# Patient Record
Sex: Female | Born: 1990 | Race: White | Hispanic: No | Smoking: Former smoker
Health system: Southern US, Community
[De-identification: ages and names within clinical notes are randomized; demographics above are authoritative.]

## PROBLEM LIST (undated history)

## (undated) ENCOUNTER — Inpatient Hospital Stay (HOSPITAL_COMMUNITY): Payer: Self-pay

## (undated) DIAGNOSIS — R51 Headache: Secondary | ICD-10-CM

## (undated) DIAGNOSIS — R519 Headache, unspecified: Secondary | ICD-10-CM

## (undated) DIAGNOSIS — A630 Anogenital (venereal) warts: Secondary | ICD-10-CM

## (undated) DIAGNOSIS — F32A Depression, unspecified: Secondary | ICD-10-CM

## (undated) DIAGNOSIS — F329 Major depressive disorder, single episode, unspecified: Secondary | ICD-10-CM

## (undated) DIAGNOSIS — B009 Herpesviral infection, unspecified: Secondary | ICD-10-CM

## (undated) DIAGNOSIS — F319 Bipolar disorder, unspecified: Secondary | ICD-10-CM

## (undated) DIAGNOSIS — B999 Unspecified infectious disease: Secondary | ICD-10-CM

## (undated) HISTORY — DX: Major depressive disorder, single episode, unspecified: F32.9

## (undated) HISTORY — PX: NO PAST SURGERIES: SHX2092

## (undated) HISTORY — DX: Depression, unspecified: F32.A

## (undated) HISTORY — DX: Herpesviral infection, unspecified: B00.9

---
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
## 2008-10-20 ENCOUNTER — Ambulatory Visit: Payer: Self-pay | Admitting: Women's Health

## 2009-01-27 ENCOUNTER — Encounter: Payer: Self-pay | Admitting: Women's Health

## 2009-01-27 ENCOUNTER — Other Ambulatory Visit: Admission: RE | Admit: 2009-01-27 | Discharge: 2009-01-27 | Payer: Self-pay | Admitting: Gynecology

## 2009-01-27 ENCOUNTER — Ambulatory Visit: Payer: Self-pay | Admitting: Women's Health

## 2009-08-23 ENCOUNTER — Ambulatory Visit: Payer: Self-pay | Admitting: Women's Health

## 2010-06-07 ENCOUNTER — Other Ambulatory Visit: Admission: RE | Admit: 2010-06-07 | Discharge: 2010-06-07 | Payer: Self-pay | Admitting: Gynecology

## 2010-06-07 ENCOUNTER — Ambulatory Visit: Payer: Self-pay | Admitting: Women's Health

## 2011-06-25 ENCOUNTER — Telehealth: Payer: Self-pay

## 2011-06-25 MED ORDER — ETONOGESTREL-ETHINYL ESTRADIOL 0.12-0.015 MG/24HR VA RING: VAGINAL_RING | VAGINAL | Status: DC

## 2011-06-25 NOTE — Telephone Encounter (Signed)
PT. NOTIFIED SHE CAN PICK UP NUVARING SAMPLE BUT MUST SET UP AEX WITH NANCY WHILE IN OFFICE. I ALSO PUT A NOTE AT CHECK IN OF THE SAME INFO. ABOVE.

## 2011-06-27 LAB — POCT URINALYSIS DIP (DEVICE)
Bilirubin Urine: NEGATIVE
Glucose, UA: NEGATIVE
Nitrite: NEGATIVE

## 2011-06-27 LAB — URINE CULTURE: Colony Count: 75000

## 2011-06-27 LAB — POCT PREGNANCY, URINE: Preg Test, Ur: NEGATIVE

## 2011-07-11 ENCOUNTER — Encounter: Payer: Self-pay | Admitting: Women's Health

## 2011-07-11 ENCOUNTER — Other Ambulatory Visit (HOSPITAL_COMMUNITY)
Admission: RE | Admit: 2011-07-11 | Discharge: 2011-07-11 | Disposition: A | Discharge status: Home / Self Care | Payer: BC Managed Care – PPO | Source: Ambulatory Visit | Attending: Gynecology | Admitting: Gynecology

## 2011-07-11 ENCOUNTER — Ambulatory Visit (INDEPENDENT_AMBULATORY_CARE_PROVIDER_SITE_OTHER): Payer: BC Managed Care – PPO | Admitting: Women's Health

## 2011-07-11 VITALS — BP 110/70 | Ht 67.5 in | Wt 139.0 lb

## 2011-07-11 DIAGNOSIS — R82998 Other abnormal findings in urine: Secondary | ICD-10-CM

## 2011-07-11 DIAGNOSIS — Z01419 Encounter for gynecological examination (general) (routine) without abnormal findings: Secondary | ICD-10-CM

## 2011-07-11 DIAGNOSIS — B373 Candidiasis of vulva and vagina: Secondary | ICD-10-CM

## 2011-07-11 DIAGNOSIS — B3731 Acute candidiasis of vulva and vagina: Secondary | ICD-10-CM

## 2011-07-11 DIAGNOSIS — R350 Frequency of micturition: Secondary | ICD-10-CM

## 2011-07-11 DIAGNOSIS — Z113 Encounter for screening for infections with a predominantly sexual mode of transmission: Secondary | ICD-10-CM

## 2011-07-11 DIAGNOSIS — IMO0001 Reserved for inherently not codable concepts without codable children: Secondary | ICD-10-CM

## 2011-07-11 DIAGNOSIS — Z309 Encounter for contraceptive management, unspecified: Secondary | ICD-10-CM

## 2011-07-11 MED ORDER — ETONOGESTREL-ETHINYL ESTRADIOL 0.12-0.015 MG/24HR VA RING: VAGINAL_RING | VAGINAL | Status: DC

## 2011-07-11 MED ORDER — SULFAMETHOXAZOLE-TRIMETHOPRIM 800-160 MG PO TABS: 1.0000 | ORAL_TABLET | Freq: Two times a day (BID) | ORAL | Status: AC

## 2011-07-11 MED ORDER — FLUCONAZOLE 150 MG PO TABS: 150.0000 mg | ORAL_TABLET | Freq: Once | ORAL | Status: AC

## 2011-07-11 NOTE — Progress Notes (Signed)
Lindsay Robles 16-Jun-1991 [id number]    History:    The patient presents for annual exam.  Student at [employer], majoring in PT assistant.   Past medical history, past surgical history, family history and social history were all reviewed and documented in the EPIC chart.   ROS:  A  ROS was performed and pertinent positives and negatives are included in the history.  Exam:  Filed Vitals:   07/11/11 0957  BP: 110/70    General appearance:  Normal Head/Neck:  Normal, without cervical or supraclavicular adenopathy. Thyroid:  Symmetrical, normal in size, without palpable masses or nodularity. Respiratory  Effort:  Normal  Auscultation:  Clear without wheezing or rhonchi Cardiovascular  Auscultation:  Regular rate, without rubs, murmurs or gallops  Edema/varicosities:  Not grossly evident Abdominal  Soft,nontender, without masses, guarding or rebound.  Liver/spleen:  No organomegaly noted  Hernia:  None appreciated  Skin  Inspection:  Grossly normal  Palpation:  Grossly normal Neurologic/psychiatric  Orientation:  Normal with appropriate conversation.  Mood/affect:  Normal  Genitourinary    Breasts: Examined lying and sitting.     Right: Without masses, retractions, discharge or axillary adenopathy.     Left: Without masses, retractions, discharge or axillary adenopathy.   Inguinal/mons:  Normal without inguinal adenopathy  External genitalia:  Normal  BUS/Urethra/Skene's glands:  Normal  Bladder:  Normal  Vagina:  Normal  Cervix:  Normal  Uterus:   normal in size, shape and contour.  Midline and mobile  Adnexa/parametria:     Rt: Without masses or tenderness.   Lt: Without masses or tenderness.  Anus and perineum: Normal  Digital rectal exam:   Assessment/Plan:  20 y.o. SWFG0  for annual exam monthly 4-5d cycle on nuva ring. States has had some increased in frequency urgency and pain with urination. UA today does show 5-10 wbc's and +2 bacteria. New partner. Gardasil  completed in 08  Normal GYN exam/STD screen/UTI  Plan: Septra DS one by mouth twice a day for 3 days, prescription, proper use was given. Diflucan 150 by mouth x1 dose, states gets yeast symptoms with antibiotics. Return if no relief of symptoms. Nuva ring prescription, proper use, slight risk for blood clots strokes reviewed.  Sample, discount card was given. Condoms encouraged. SBEs, exercise, MVI daily encouraged, campus safety reviewed. CBC, UA, Pap, GC/ Chlamydia, HIV, hep B and C., RPRHarrington Challenger WHNP, 10:25 AM 07/11/2011

## 2011-07-12 LAB — HIV ANTIBODY (ROUTINE TESTING W REFLEX): HIV: NONREACTIVE

## 2011-08-05 ENCOUNTER — Telehealth: Payer: Self-pay | Admitting: *Deleted

## 2011-08-05 NOTE — Telephone Encounter (Signed)
Please call, inform we do not prescribe Adderall. Please have her call her primary care.

## 2011-08-05 NOTE — Telephone Encounter (Signed)
Patient requesting rx for Adderall.  Said pcp is not available and forgot to discuss with you at annual exam.  Has a test this week and would like to have it on board for that day.  She said she is not sure of the strength.  (dont see in chart)  Please advise

## 2011-08-05 NOTE — Telephone Encounter (Signed)
Please calland inform we do not prescribe Adderall. Have her call her primary care. And wish her luck onher test.

## 2011-08-06 NOTE — Telephone Encounter (Signed)
Patient informed. 

## 2011-10-21 ENCOUNTER — Encounter: Payer: Self-pay | Admitting: *Deleted

## 2011-10-21 NOTE — Progress Notes (Signed)
Patient ID: Lindsay Robles, female   DOB: [birth year], 21 y.o.   MRN: [id number] Pt is having hard time paying for nuvaring birth control wanted to know if could have samples, lm on pt vm okay to stop by and pick up samples.

## 2011-12-02 ENCOUNTER — Telehealth: Payer: Self-pay | Admitting: *Deleted

## 2011-12-02 NOTE — Telephone Encounter (Signed)
Pt called asking if she could have nuvaring samples. Pt is in college and having a hard time paying. Spoke with pt and told her okay to pick up samples.

## 2012-01-01 ENCOUNTER — Telehealth: Payer: Self-pay | Admitting: *Deleted

## 2012-01-01 NOTE — Telephone Encounter (Signed)
Pt called requesting 1 sample of nuvaring and wanted rx for UTI. Left message on pt vm OV for UTI and may have 1 sample of nuvaring.

## 2012-01-09 ENCOUNTER — Encounter: Payer: Self-pay | Admitting: Women's Health

## 2012-01-09 ENCOUNTER — Ambulatory Visit (INDEPENDENT_AMBULATORY_CARE_PROVIDER_SITE_OTHER): Payer: BC Managed Care – PPO | Admitting: Women's Health

## 2012-01-09 DIAGNOSIS — B379 Candidiasis, unspecified: Secondary | ICD-10-CM

## 2012-01-09 DIAGNOSIS — B49 Unspecified mycosis: Secondary | ICD-10-CM

## 2012-01-09 DIAGNOSIS — R300 Dysuria: Secondary | ICD-10-CM

## 2012-01-09 LAB — URINALYSIS W MICROSCOPIC + REFLEX CULTURE
Glucose, UA: NEGATIVE mg/dL
Leukocytes, UA: NEGATIVE
pH: 6.5 (ref 5.0–8.0)

## 2012-01-09 LAB — WET PREP FOR TRICH, YEAST, CLUE

## 2012-01-09 MED ORDER — FLUCONAZOLE 150 MG PO TABS: 150.0000 mg | ORAL_TABLET | Freq: Once | ORAL | Status: AC

## 2012-01-09 NOTE — Progress Notes (Signed)
Patient ID: Lindsay Robles, female   DOB: 03-Nov-1990, 21 y.o.   MRN: [id number] Presents with the complaint of increased urinary frequency, slight discomfort at end of stream, denies a fever and scant white discharge. Same partner, negative cultures contracepting on nuva ring.  Exam: No CVAT, he away negative, external genitalia slight erythema, speculum exam scant white discharge, wet prep positive for few yeast. Bimanual no CMT or adnexal fullness or tenderness.  Yeast  Plan: Urine culture pending. Diflucan 150 today repeat in 3 days if necessary call if no relief of symptoms.

## 2012-01-10 ENCOUNTER — Ambulatory Visit: Payer: BC Managed Care – PPO | Admitting: Women's Health

## 2012-01-12 LAB — URINE CULTURE: Colony Count: 75000

## 2012-01-13 ENCOUNTER — Other Ambulatory Visit: Payer: Self-pay | Admitting: *Deleted

## 2012-01-13 MED ORDER — CIPROFLOXACIN HCL 500 MG PO TABS: 500.0000 mg | ORAL_TABLET | Freq: Two times a day (BID) | ORAL | Status: AC

## 2012-04-03 ENCOUNTER — Ambulatory Visit (INDEPENDENT_AMBULATORY_CARE_PROVIDER_SITE_OTHER): Payer: BC Managed Care – PPO | Admitting: Family Medicine

## 2012-04-03 ENCOUNTER — Encounter: Payer: Self-pay | Admitting: Internal Medicine

## 2012-04-03 VITALS — BP 106/68 | HR 70 | Temp 98.3°F | Resp 16 | Ht 67.0 in | Wt 141.6 lb

## 2012-04-03 DIAGNOSIS — L84 Corns and callosities: Secondary | ICD-10-CM

## 2012-04-03 DIAGNOSIS — L0390 Cellulitis, unspecified: Secondary | ICD-10-CM

## 2012-04-03 DIAGNOSIS — B351 Tinea unguium: Secondary | ICD-10-CM

## 2012-04-03 DIAGNOSIS — L0291 Cutaneous abscess, unspecified: Secondary | ICD-10-CM

## 2012-04-03 MED ORDER — DOXYCYCLINE HYCLATE 100 MG PO TABS: 100.0000 mg | ORAL_TABLET | Freq: Two times a day (BID) | ORAL | Status: AC

## 2012-04-03 MED ORDER — TERBINAFINE HCL 250 MG PO TABS: 250.0000 mg | ORAL_TABLET | Freq: Every day | ORAL | Status: DC

## 2012-04-03 NOTE — Progress Notes (Signed)
This 21 year old woman who comes in for dilation of several problems. 2 weeks ago she lacerated her left lower extremity it's been having pain and some green eschar forming in the wound since. The area is indurated and slightly tender with surrounding erythema.  She also has a corn between her fourth and fifth toes on the right which has been going on since she wore tightfitting shoes month ago. She's on her feet all day long and this probably contributes to the problem.  Finally she notes thickening and distortion of the right fifth toenail. There is no pain in her and despite trimming the nail thickening has continued.  Objective: 1-cellulitis left lower leg surrounding a 1 inch laceration with green/black eschar. 2-corn between fourth and fifth toes was debrided with a #15 blade down to the base and a cotton wick with Coban was applied 3-onychomycotic right fifth toenail  Assessment: Cellulitis, corn, onychomycosis  Plan: Doxycycline 100 twice a day x7 days, Lamisil 250 daily x1 month, continue the cotton ball between the toes with Coban for month

## 2012-04-20 ENCOUNTER — Telehealth: Payer: Self-pay | Admitting: *Deleted

## 2012-04-20 DIAGNOSIS — IMO0001 Reserved for inherently not codable concepts without codable children: Secondary | ICD-10-CM

## 2012-04-20 MED ORDER — ETONOGESTREL-ETHINYL ESTRADIOL 0.12-0.015 MG/24HR VA RING: VAGINAL_RING | VAGINAL | Status: DC

## 2012-04-20 NOTE — Telephone Encounter (Signed)
Pt called requesting if 1 sample of nuvring can be given,pt will pick up. rx for nuvring sent to [facility] on [address] per request.

## 2012-06-01 ENCOUNTER — Telehealth: Payer: Self-pay | Admitting: *Deleted

## 2012-06-01 DIAGNOSIS — IMO0001 Reserved for inherently not codable concepts without codable children: Secondary | ICD-10-CM

## 2012-06-01 MED ORDER — ETONOGESTREL-ETHINYL ESTRADIOL 0.12-0.015 MG/24HR VA RING: VAGINAL_RING | VAGINAL | Status: DC

## 2012-06-01 NOTE — Telephone Encounter (Signed)
Pt called requesting nuvaring rx re-sent to [facility] [address], rx resent.

## 2012-07-29 ENCOUNTER — Telehealth: Payer: Self-pay

## 2012-07-29 NOTE — Telephone Encounter (Signed)
Patient called requesting an Rx refill for Amphetamine-Dextroamphetamine (ADDERALL PO). Patients pcp is [place] and wondered if she needed to come in for the refill to be seen or if it can be refilled for her to pick up. Call patient back at [phone].

## 2012-07-29 NOTE — Telephone Encounter (Signed)
D/W pt need to RTC for ADD OV and she agreed to come and see Dr L or another provider if she can not come during his schedule. Transferred pt to appt center to check on appts.

## 2012-09-14 ENCOUNTER — Encounter: Payer: BC Managed Care – PPO | Admitting: Women's Health

## 2012-11-13 ENCOUNTER — Other Ambulatory Visit (HOSPITAL_COMMUNITY)
Admission: RE | Admit: 2012-11-13 | Discharge: 2012-11-13 | Disposition: A | Discharge status: Home / Self Care | Payer: BC Managed Care – PPO | Source: Ambulatory Visit | Attending: Obstetrics and Gynecology | Admitting: Obstetrics and Gynecology

## 2012-11-13 ENCOUNTER — Encounter: Payer: Self-pay | Admitting: Women's Health

## 2012-11-13 ENCOUNTER — Ambulatory Visit (INDEPENDENT_AMBULATORY_CARE_PROVIDER_SITE_OTHER): Payer: BC Managed Care – PPO | Admitting: Women's Health

## 2012-11-13 VITALS — BP 116/64 | Ht 67.5 in | Wt 148.0 lb

## 2012-11-13 DIAGNOSIS — IMO0001 Reserved for inherently not codable concepts without codable children: Secondary | ICD-10-CM

## 2012-11-13 DIAGNOSIS — Z01419 Encounter for gynecological examination (general) (routine) without abnormal findings: Secondary | ICD-10-CM | POA: Insufficient documentation

## 2012-11-13 DIAGNOSIS — Z309 Encounter for contraceptive management, unspecified: Secondary | ICD-10-CM

## 2012-11-13 MED ORDER — ETONOGESTREL-ETHINYL ESTRADIOL 0.12-0.015 MG/24HR VA RING: VAGINAL_RING | VAGINAL | Status: DC

## 2012-11-13 NOTE — Patient Instructions (Signed)

## 2012-11-13 NOTE — Progress Notes (Signed)
Lindsay Robles 24-Jun-1991 [id number]    History:    The patient presents for annual exam.  Regular menses,  on Nuva Ring with no complaints. 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 Gardasil series completed 2008. C/o burning with urination, especially after soap use, relieved with OTC Azo. Same partner, 2 years, condom used. C/o intermittent, uncomfortable bump, superior right labia majora/no change.   Past medical history, past surgical history, family history and social history were all reviewed and documented in the EPIC chart. Diplomatic Services operational officer at [employer]. 1-2 cigarettes/day. Breast Cancer- PGM, Heart Disease- MGM, HTN - F   ROS:  A  ROS was performed and pertinent positives and negatives are included in the history.  Exam:  Filed Vitals:   11/13/22 1452  BP: 116/64    General appearance:  Normal Head/Neck:  Normal, without cervical or supraclavicular adenopathy. Thyroid:  Symmetrical, normal in size, without palpable masses or nodularity. Respiratory  Effort:  Normal  Auscultation:  Clear without wheezing or rhonchi Cardiovascular  Auscultation:  Regular rate, without rubs, murmurs or gallops  Edema/varicosities:  Not grossly evident Abdominal  Soft,nontender, without masses, guarding or rebound.  Liver/spleen:  No organomegaly noted  Hernia:  None appreciated  Skin  Inspection:  Grossly normal  Palpation:  Grossly normal Neurologic/psychiatric  Orientation:  Normal with appropriate conversation.  Mood/affect:  Normal  Genitourinary    Breasts: Examined lying and sitting.     Right: Without masses, retractions, discharge or axillary adenopathy.     Left: Without masses, retractions, discharge or axillary adenopathy.   Inguinal/mons:  Normal without inguinal adenopathy  External genitalia:  small, superficial 0.5 cm sebaceous cyst, right superior labia majora. No change  BUS/Urethra/Skene's glands:  Normal  Bladder:  Normal  Vagina:  Normal  Cervix:  Normal  Uterus:   normal in size, shape and contour.  Midline and mobile  Adnexa/parametria:     Rt: Without masses or tenderness.   Lt: Without masses or tenderness.  Anus and perineum: Normal    Assessment/Plan:  22 y.o.  S WF G0 for annual exam.     PAP, 10/12 CIN 1 Nuva Ring  Plan:Nuva Ring Rx, reviewed slight risk of blood clot and stroke. PAP, new guidelines reviewed. Encouraged healthy diet, exercise, daily multivitamin, SBEs, condom use, and avoidance of tanning beds. Discussed importance of no smoking. CBC deferred until next visit, fear of needles.UAHarrington Challenger WHNP, 3:24 PM 11/13/2012

## 2012-11-14 LAB — URINALYSIS W MICROSCOPIC + REFLEX CULTURE
Glucose, UA: NEGATIVE mg/dL
Hgb urine dipstick: NEGATIVE
Ketones, ur: NEGATIVE mg/dL
pH: 6 (ref 5.0–8.0)

## 2012-11-15 LAB — URINE CULTURE: Colony Count: NO GROWTH

## 2012-11-23 ENCOUNTER — Encounter: Payer: BC Managed Care – PPO | Admitting: Women's Health

## 2012-12-16 ENCOUNTER — Ambulatory Visit: Payer: BC Managed Care – PPO | Admitting: Family Medicine

## 2013-04-27 ENCOUNTER — Telehealth: Payer: Self-pay | Admitting: *Deleted

## 2013-04-27 NOTE — Telephone Encounter (Signed)
Pt called requesting 1 sample of nuvaring, left message on voicemail okay to pick up 1 sample only. Pt said she is unable to get rx now due to funds.

## 2013-06-25 ENCOUNTER — Telehealth: Payer: Self-pay | Admitting: *Deleted

## 2013-06-25 NOTE — Telephone Encounter (Signed)
Pt called requesting sample nuvaring, told pt okay to pick up 1 sample. She also asked when was last annual, left on voicemail feb/2014.

## 2013-08-20 ENCOUNTER — Telehealth: Payer: Self-pay | Admitting: *Deleted

## 2013-08-20 NOTE — Telephone Encounter (Signed)
Pt called requesting nuvaring sample, I called back and told okay to come pick up.

## 2013-10-15 ENCOUNTER — Telehealth: Payer: Self-pay | Admitting: *Deleted

## 2013-10-15 NOTE — Telephone Encounter (Signed)
PT CALLED REQUESTING NUVARING SAMPLE TO PICK, LEFT ON VOICEMAIL OKAY TO PICK UP.

## 2013-10-18 ENCOUNTER — Telehealth: Payer: Self-pay | Admitting: *Deleted

## 2013-10-18 MED ORDER — FLUCONAZOLE 150 MG PO TABS: 150.0000 mg | ORAL_TABLET | Freq: Once | ORAL | Status: DC

## 2013-10-18 NOTE — Telephone Encounter (Signed)
rx sent, pt informed.  

## 2013-10-18 NOTE — Telephone Encounter (Signed)
(  You are back up MD)Pt called c/o terrible yeast infection vaginal itching, vaginal irritation cycle is on now, asked if you would be willing to give Rx to help?

## 2013-10-18 NOTE — Telephone Encounter (Signed)
Diflucan 150 mg x1 dose. Office visit if continues.

## 2013-11-25 ENCOUNTER — Ambulatory Visit (INDEPENDENT_AMBULATORY_CARE_PROVIDER_SITE_OTHER): Payer: BC Managed Care – PPO | Admitting: Family Medicine

## 2013-11-25 VITALS — BP 110/60 | HR 91 | Temp 98.0°F | Resp 16 | Ht 68.0 in | Wt 144.0 lb

## 2013-11-25 DIAGNOSIS — J321 Chronic frontal sinusitis: Secondary | ICD-10-CM

## 2013-11-25 DIAGNOSIS — R51 Headache: Secondary | ICD-10-CM

## 2013-11-25 DIAGNOSIS — J309 Allergic rhinitis, unspecified: Secondary | ICD-10-CM

## 2013-11-25 MED ORDER — AMOXICILLIN-POT CLAVULANATE 875-125 MG PO TABS: 1.0000 | ORAL_TABLET | Freq: Two times a day (BID) | ORAL | Status: DC

## 2013-11-25 NOTE — Patient Instructions (Signed)
Start antibiotic, saline nasal spray 4-5 times a day and over the counter Allegra and Flonase nasal spray. Return to the clinic or go to the nearest emergency room if any of your symptoms worsen or new symptoms occur.   Allergic Rhinitis Allergic rhinitis is when the mucous membranes in the nose respond to allergens. Allergens are particles in the air that cause your body to have an allergic reaction. This causes you to release allergic antibodies. Through a chain of events, these eventually cause you to release histamine into the blood stream. Although meant to protect the body, it is this release of histamine that causes your discomfort, such as frequent sneezing, congestion, and an itchy, runny nose.  CAUSES  Seasonal allergic rhinitis (hay fever) is caused by pollen allergens that may come from grasses, trees, and weeds. Year-round allergic rhinitis (perennial allergic rhinitis) is caused by allergens such as house dust mites, pet dander, and mold spores.  SYMPTOMS   Nasal stuffiness (congestion).  Itchy, runny nose with sneezing and tearing of the eyes. DIAGNOSIS  Your health care provider can help you determine the allergen or allergens that trigger your symptoms. If you and your health care provider are unable to determine the allergen, skin or blood testing may be used. TREATMENT  Allergic Rhinitis does not have a cure, but it can be controlled by:  Medicines and allergy shots (immunotherapy).  Avoiding the allergen. Hay fever may often be treated with antihistamines in pill or nasal spray forms. Antihistamines block the effects of histamine. There are over-the-counter medicines that may help with nasal congestion and swelling around the eyes. Check with your health care provider before taking or giving this medicine.  If avoiding the allergen or the medicine prescribed do not work, there are many new medicines your health care provider can prescribe. Stronger medicine may be used if  initial measures are ineffective. Desensitizing injections can be used if medicine and avoidance does not work. Desensitization is when a patient is given ongoing shots until the body becomes less sensitive to the allergen. Make sure you follow up with your health care provider if problems continue. HOME CARE INSTRUCTIONS It is not possible to completely avoid allergens, but you can reduce your symptoms by taking steps to limit your exposure to them. It helps to know exactly what you are allergic to so that you can avoid your specific triggers. SEEK MEDICAL CARE IF:   You have a fever.  You develop a cough that does not stop easily (persistent).  You have shortness of breath.  You start wheezing.  Symptoms interfere with normal daily activities. Document Released: 06/18/2001 Document Revised: 07/14/2013 Document Reviewed: 05/31/2013 [facility] Patient Information 2014 [place], [place].   Sinusitis Sinusitis is redness, soreness, and swelling (inflammation) of the paranasal sinuses. Paranasal sinuses are air pockets within the bones of your face (beneath the eyes, the middle of the forehead, or above the eyes). In healthy paranasal sinuses, mucus is able to drain out, and air is able to circulate through them by way of your nose. However, when your paranasal sinuses are inflamed, mucus and air can become trapped. This can allow bacteria and other germs to grow and cause infection. Sinusitis can develop quickly and last only a short time (acute) or continue over a long period (chronic). Sinusitis that lasts for more than 12 weeks is considered chronic.  CAUSES  Causes of sinusitis include:  Allergies.  Structural abnormalities, such as displacement of the cartilage that separates your nostrils (deviated  septum), which can decrease the air flow through your nose and sinuses and affect sinus drainage.  Functional abnormalities, such as when the small hairs (cilia) that line your sinuses and help  remove mucus do not work properly or are not present. SYMPTOMS  Symptoms of acute and chronic sinusitis are the same. The primary symptoms are pain and pressure around the affected sinuses. Other symptoms include:  Upper toothache.  Earache.  Headache.  Bad breath.  Decreased sense of smell and taste.  A cough, which worsens when you are lying flat.  Fatigue.  Fever.  Thick drainage from your nose, which often is green and may contain pus (purulent).  Swelling and warmth over the affected sinuses. DIAGNOSIS  Your caregiver will perform a physical exam. During the exam, your caregiver may:  Look in your nose for signs of abnormal growths in your nostrils (nasal polyps).  Tap over the affected sinus to check for signs of infection.  View the inside of your sinuses (endoscopy) with a special imaging device with a light attached (endoscope), which is inserted into your sinuses. If your caregiver suspects that you have chronic sinusitis, one or more of the following tests may be recommended:  Allergy tests.  Nasal culture A sample of mucus is taken from your nose and sent to a lab and screened for bacteria.  Nasal cytology A sample of mucus is taken from your nose and examined by your caregiver to determine if your sinusitis is related to an allergy. TREATMENT  Most cases of acute sinusitis are related to a viral infection and will resolve on their own within 10 days. Sometimes medicines are prescribed to help relieve symptoms (pain medicine, decongestants, nasal steroid sprays, or saline sprays).  However, for sinusitis related to a bacterial infection, your caregiver will prescribe antibiotic medicines. These are medicines that will help kill the bacteria causing the infection.  Rarely, sinusitis is caused by a fungal infection. In theses cases, your caregiver will prescribe antifungal medicine. For some cases of chronic sinusitis, surgery is needed. Generally, these are cases  in which sinusitis recurs more than 3 times per year, despite other treatments. HOME CARE INSTRUCTIONS   Drink plenty of water. Water helps thin the mucus so your sinuses can drain more easily.  Use a humidifier.  Inhale steam 3 to 4 times a day (for example, sit in the bathroom with the shower running).  Apply a warm, moist washcloth to your face 3 to 4 times a day, or as directed by your caregiver.  Use saline nasal sprays to help moisten and clean your sinuses.  Take over-the-counter or prescription medicines for pain, discomfort, or fever only as directed by your caregiver. SEEK IMMEDIATE MEDICAL CARE IF:  You have increasing pain or severe headaches.  You have nausea, vomiting, or drowsiness.  You have swelling around your face.  You have vision problems.  You have a stiff neck.  You have difficulty breathing. MAKE SURE YOU:   Understand these instructions.  Will watch your condition.  Will get help right away if you are not doing well or get worse. Document Released: 09/23/2005 Document Revised: 12/16/2011 Document Reviewed: 10/08/2011 [facility] Patient Information 2014 [place], [place].

## 2013-11-25 NOTE — Progress Notes (Signed)
Subjective:    Patient ID: Lindsay Robles, female    DOB: 01-Jan-1991, 23 y.o.   MRN: [id number] This chart was scribed for Lindsay Staggers, MD by Valera Castle, ED Scribe. This patient was seen in room 12 and the patient's care was started at 4:36 PM.  Chief Complaint  Patient presents with  . Headache    x 3 weeks  . Sore Throat    x 3 weeks   HPI MAURENE Robles is a 23 y.o. female Pt presents with sore throat, post-nasal drip, rhinorrhea, onset 1 month ago. She reports yellow nasal discharge for the last 2 weeks. She reports headache, sinus pressure onset 1 week ago. She reports this morning waking up with worsening headache, and states that movement of her head exacerbates her pain. She also reports persistent, cough minimally productive and generalized body aches. She reports trying Ibuprofen, Dayquil, without relief. She denies trying Sudafed. She denies allergies to antibiotic. She reports h/o headaches, and reports her father has h/o headaches.   She denies being around any sick contacts. She denies having recently moved to the area. She reports seasonal allergies in the Summer time. She denies having done recent cleaning, denies allergy to dust. 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 She has 3 mixed dogs at home. She denies fever, weight loss, ear pain, and any other associated symptoms.  PCP Elvina Sidle, MD  There are no active problems to display for this patient.  History reviewed. No pertinent past medical history. History reviewed. No pertinent past surgical history. No Known Allergies Prior to Admission medications   Medication Sig Start Date End Date Taking? Authorizing Provider  etonogestrel-ethinyl estradiol (NUVARING) 0.12-0.015 MG/24HR vaginal ring Insert vaginally and leave in place for 3 consecutive weeks, then remove for 1 week. 11/13/12 11/25/13 Yes Harrington Challenger, NP   Review of Systems  Constitutional: Negative for fever and unexpected weight  change.  HENT: Positive for congestion, postnasal drip, rhinorrhea, sinus pressure and sore throat. Negative for ear pain and trouble swallowing.   Respiratory: Positive for cough.   Musculoskeletal:       + for generalized body aches  Allergic/Immunologic: Positive for environmental allergies.  Neurological: Positive for headaches.      Objective:   Physical Exam  Vitals reviewed. Constitutional: She is oriented to person, place, and time. She appears well-developed and well-nourished. No distress.  HENT:  Head: Normocephalic and atraumatic.  Right Ear: Hearing, tympanic membrane, external ear and ear canal normal.  Left Ear: Hearing, tympanic membrane, external ear and ear canal normal.  Nose: Mucosal edema present. No rhinorrhea. Right sinus exhibits frontal sinus tenderness. Right sinus exhibits no maxillary sinus tenderness. Left sinus exhibits frontal sinus tenderness. Left sinus exhibits no maxillary sinus tenderness.  Mouth/Throat: Uvula is midline, oropharynx is clear and moist and mucous membranes are normal. No oropharyngeal exudate, posterior oropharyngeal edema, posterior oropharyngeal erythema or tonsillar abscesses.  Edematous turbinates bilaterally. No discharge.   Eyes: Conjunctivae and EOM are normal. Pupils are equal, round, and reactive to light.  Minimal infraorbital edema.   Neck: Normal range of motion. Neck supple. No thyromegaly present.  Cardiovascular: Normal rate, regular rhythm, normal heart sounds and intact distal pulses.  Exam reveals no gallop and no friction rub.   No murmur heard. Pulmonary/Chest: Effort normal and breath sounds normal. No respiratory distress. She has no wheezes. She has no rhonchi. She has no rales.  Musculoskeletal: Normal range of motion. She exhibits no  edema and no tenderness.  Lymphadenopathy:    She has no cervical adenopathy.  Neurological: She is alert and oriented to person, place, and time. No cranial nerve deficit. She  exhibits normal muscle tone. She displays a negative Romberg sign. Coordination and gait normal.  Negative pronator drift. Negative heel to toe.  Skin: Skin is warm and dry. No rash noted.  Psychiatric: She has a normal mood and affect. Her behavior is normal.   BP 110/60  Pulse 91  Temp(Src) 98 F (36.7 C) (Oral)  Resp 16  Ht 5\' 8"  (1.727 m)  Wt 144 lb (65.318 kg)  BMI 21.90 kg/m2  SpO2 98%  LMP 11/01/2013     Assessment & Plan:   Lindsay Robles is a 23 y.o. female Frontal sinusitis - Plan: amoxicillin-clavulanate (AUGMENTIN) 875-125 MG per tablet  Allergic rhinitis  Headache(784.0)  Suspected allergic rhinitis, with secondary frontal sinusitis. Start otc allegra and flonase, augmentin, sx care, and rtc precautions given.   Meds ordered this encounter  Medications  . amoxicillin-clavulanate (AUGMENTIN) 875-125 MG per tablet    Sig: Take 1 tablet by mouth 2 (two) times daily.    Dispense:  20 tablet    Refill:  0   Patient Instructions  Start antibiotic, saline nasal spray 4-5 times a day and over the counter Allegra and Flonase nasal spray. Return to the clinic or go to the nearest emergency room if any of your symptoms worsen or new symptoms occur.   Allergic Rhinitis Allergic rhinitis is when the mucous membranes in the nose respond to allergens. Allergens are particles in the air that cause your body to have an allergic reaction. This causes you to release allergic antibodies. Through a chain of events, these eventually cause you to release histamine into the blood stream. Although meant to protect the body, it is this release of histamine that causes your discomfort, such as frequent sneezing, congestion, and an itchy, runny nose.  CAUSES  Seasonal allergic rhinitis (hay fever) is caused by pollen allergens that may come from grasses, trees, and weeds. Year-round allergic rhinitis (perennial allergic rhinitis) is caused by allergens such as house dust mites, pet  dander, and mold spores.  SYMPTOMS   Nasal stuffiness (congestion).  Itchy, runny nose with sneezing and tearing of the eyes. DIAGNOSIS  Your health care provider can help you determine the allergen or allergens that trigger your symptoms. If you and your health care provider are unable to determine the allergen, skin or blood testing may be used. TREATMENT  Allergic Rhinitis does not have a cure, but it can be controlled by:  Medicines and allergy shots (immunotherapy).  Avoiding the allergen. Hay fever may often be treated with antihistamines in pill or nasal spray forms. Antihistamines block the effects of histamine. There are over-the-counter medicines that may help with nasal congestion and swelling around the eyes. Check with your health care provider before taking or giving this medicine.  If avoiding the allergen or the medicine prescribed do not work, there are many new medicines your health care provider can prescribe. Stronger medicine may be used if initial measures are ineffective. Desensitizing injections can be used if medicine and avoidance does not work. Desensitization is when a patient is given ongoing shots until the body becomes less sensitive to the allergen. Make sure you follow up with your health care provider if problems continue. HOME CARE INSTRUCTIONS It is not possible to completely avoid allergens, but you can reduce your symptoms by taking  steps to limit your exposure to them. It helps to know exactly what you are allergic to so that you can avoid your specific triggers. SEEK MEDICAL CARE IF:   You have a fever.  You develop a cough that does not stop easily (persistent).  You have shortness of breath.  You start wheezing.  Symptoms interfere with normal daily activities. Document Released: 06/18/2001 Document Revised: 07/14/2013 Document Reviewed: 05/31/2013 [facility] Patient Information 2014 [place], [place].   Sinusitis Sinusitis is redness, soreness,  and swelling (inflammation) of the paranasal sinuses. Paranasal sinuses are air pockets within the bones of your face (beneath the eyes, the middle of the forehead, or above the eyes). In healthy paranasal sinuses, mucus is able to drain out, and air is able to circulate through them by way of your nose. However, when your paranasal sinuses are inflamed, mucus and air can become trapped. This can allow bacteria and other germs to grow and cause infection. Sinusitis can develop quickly and last only a short time (acute) or continue over a long period (chronic). Sinusitis that lasts for more than 12 weeks is considered chronic.  CAUSES  Causes of sinusitis include:  Allergies.  Structural abnormalities, such as displacement of the cartilage that separates your nostrils (deviated septum), which can decrease the air flow through your nose and sinuses and affect sinus drainage.  Functional abnormalities, such as when the small hairs (cilia) that line your sinuses and help remove mucus do not work properly or are not present. SYMPTOMS  Symptoms of acute and chronic sinusitis are the same. The primary symptoms are pain and pressure around the affected sinuses. Other symptoms include:  Upper toothache.  Earache.  Headache.  Bad breath.  Decreased sense of smell and taste.  A cough, which worsens when you are lying flat.  Fatigue.  Fever.  Thick drainage from your nose, which often is green and may contain pus (purulent).  Swelling and warmth over the affected sinuses. DIAGNOSIS  Your caregiver will perform a physical exam. During the exam, your caregiver may:  Look in your nose for signs of abnormal growths in your nostrils (nasal polyps).  Tap over the affected sinus to check for signs of infection.  View the inside of your sinuses (endoscopy) with a special imaging device with a light attached (endoscope), which is inserted into your sinuses. If your caregiver suspects that you have  chronic sinusitis, one or more of the following tests may be recommended:  Allergy tests.  Nasal culture A sample of mucus is taken from your nose and sent to a lab and screened for bacteria.  Nasal cytology A sample of mucus is taken from your nose and examined by your caregiver to determine if your sinusitis is related to an allergy. TREATMENT  Most cases of acute sinusitis are related to a viral infection and will resolve on their own within 10 days. Sometimes medicines are prescribed to help relieve symptoms (pain medicine, decongestants, nasal steroid sprays, or saline sprays).  However, for sinusitis related to a bacterial infection, your caregiver will prescribe antibiotic medicines. These are medicines that will help kill the bacteria causing the infection.  Rarely, sinusitis is caused by a fungal infection. In theses cases, your caregiver will prescribe antifungal medicine. For some cases of chronic sinusitis, surgery is needed. Generally, these are cases in which sinusitis recurs more than 3 times per year, despite other treatments. HOME CARE INSTRUCTIONS   Drink plenty of water. Water helps thin the mucus so your  sinuses can drain more easily.  Use a humidifier.  Inhale steam 3 to 4 times a day (for example, sit in the bathroom with the shower running).  Apply a warm, moist washcloth to your face 3 to 4 times a day, or as directed by your caregiver.  Use saline nasal sprays to help moisten and clean your sinuses.  Take over-the-counter or prescription medicines for pain, discomfort, or fever only as directed by your caregiver. SEEK IMMEDIATE MEDICAL CARE IF:  You have increasing pain or severe headaches.  You have nausea, vomiting, or drowsiness.  You have swelling around your face.  You have vision problems.  You have a stiff neck.  You have difficulty breathing. MAKE SURE YOU:   Understand these instructions.  Will watch your condition.  Will get help right  away if you are not doing well or get worse. Document Released: 09/23/2005 Document Revised: 12/16/2011 Document Reviewed: 10/08/2011 [facility] Patient Information 2014 [place], [place].        I personally performed the services described in this documentation, which was scribed in my presence. The recorded information has been reviewed and considered, and addended by me as needed.

## 2013-11-29 ENCOUNTER — Telehealth: Payer: Self-pay | Admitting: *Deleted

## 2013-11-29 NOTE — Telephone Encounter (Signed)
Pt has annual scheduled in march requesting nuvaring sample. Pt informed okay to pick up 1 sample.

## 2013-12-21 ENCOUNTER — Encounter: Payer: BC Managed Care – PPO | Admitting: Women's Health

## 2014-01-31 ENCOUNTER — Other Ambulatory Visit: Payer: Self-pay

## 2014-01-31 DIAGNOSIS — IMO0001 Reserved for inherently not codable concepts without codable children: Secondary | ICD-10-CM

## 2014-01-31 MED ORDER — ETONOGESTREL-ETHINYL ESTRADIOL 0.12-0.015 MG/24HR VA RING: VAGINAL_RING | VAGINAL | Status: DC

## 2014-02-15 ENCOUNTER — Encounter: Payer: BC Managed Care – PPO | Admitting: Women's Health

## 2014-03-03 ENCOUNTER — Encounter: Payer: Self-pay | Admitting: Women's Health

## 2014-03-03 ENCOUNTER — Other Ambulatory Visit (HOSPITAL_COMMUNITY)
Admission: RE | Admit: 2014-03-03 | Discharge: 2014-03-03 | Disposition: A | Discharge status: Home / Self Care | Payer: BC Managed Care – PPO | Source: Ambulatory Visit | Attending: Gynecology | Admitting: Gynecology

## 2014-03-03 ENCOUNTER — Ambulatory Visit (INDEPENDENT_AMBULATORY_CARE_PROVIDER_SITE_OTHER): Payer: BC Managed Care – PPO | Admitting: Women's Health

## 2014-03-03 VITALS — BP 120/70 | Ht 67.25 in | Wt 147.0 lb

## 2014-03-03 DIAGNOSIS — Z113 Encounter for screening for infections with a predominantly sexual mode of transmission: Secondary | ICD-10-CM

## 2014-03-03 DIAGNOSIS — Z01419 Encounter for gynecological examination (general) (routine) without abnormal findings: Secondary | ICD-10-CM | POA: Insufficient documentation

## 2014-03-03 DIAGNOSIS — B3731 Acute candidiasis of vulva and vagina: Secondary | ICD-10-CM

## 2014-03-03 DIAGNOSIS — B373 Candidiasis of vulva and vagina: Secondary | ICD-10-CM

## 2014-03-03 DIAGNOSIS — Z309 Encounter for contraceptive management, unspecified: Secondary | ICD-10-CM

## 2014-03-03 DIAGNOSIS — IMO0001 Reserved for inherently not codable concepts without codable children: Secondary | ICD-10-CM

## 2014-03-03 DIAGNOSIS — F17200 Nicotine dependence, unspecified, uncomplicated: Secondary | ICD-10-CM

## 2014-03-03 DIAGNOSIS — F17210 Nicotine dependence, cigarettes, uncomplicated: Secondary | ICD-10-CM | POA: Insufficient documentation

## 2014-03-03 DIAGNOSIS — N898 Other specified noninflammatory disorders of vagina: Secondary | ICD-10-CM

## 2014-03-03 LAB — CBC WITH DIFFERENTIAL/PLATELET
BASOS ABS: 0 10*3/uL (ref 0.0–0.1)
BASOS PCT: 0 % (ref 0–1)
EOS PCT: 1 % (ref 0–5)
Eosinophils Absolute: 0.1 10*3/uL (ref 0.0–0.7)
HEMATOCRIT: 40.6 % (ref 36.0–46.0)
Hemoglobin: 13.9 g/dL (ref 12.0–15.0)
Lymphocytes Relative: 28 % (ref 12–46)
Lymphs Abs: 2 10*3/uL (ref 0.7–4.0)
MCH: 30.5 pg (ref 26.0–34.0)
MCHC: 34.2 g/dL (ref 30.0–36.0)
MCV: 89 fL (ref 78.0–100.0)
MONO ABS: 0.6 10*3/uL (ref 0.1–1.0)
Monocytes Relative: 8 % (ref 3–12)
NEUTROS ABS: 4.6 10*3/uL (ref 1.7–7.7)
Neutrophils Relative %: 63 % (ref 43–77)
PLATELETS: 356 10*3/uL (ref 150–400)
RBC: 4.56 MIL/uL (ref 3.87–5.11)
RDW: 13.6 % (ref 11.5–15.5)
WBC: 7.3 10*3/uL (ref 4.0–10.5)

## 2014-03-03 LAB — WET PREP FOR TRICH, YEAST, CLUE
Clue Cells Wet Prep HPF POC: NONE SEEN
TRICH WET PREP: NONE SEEN

## 2014-03-03 MED ORDER — ETONOGESTREL-ETHINYL ESTRADIOL 0.12-0.015 MG/24HR VA RING: VAGINAL_RING | VAGINAL | Status: DC

## 2014-03-03 MED ORDER — FLUCONAZOLE 150 MG PO TABS: 150.0000 mg | ORAL_TABLET | Freq: Once | ORAL | Status: DC

## 2014-03-03 NOTE — Patient Instructions (Signed)

## 2014-03-03 NOTE — Progress Notes (Addendum)
Lindsay Robles May 14, 1991 [id number]    History:    Presents for annual exam.  Regular monthly cycle on NuvaRing. Gardasil series completed 2008. New partner. 2012 Pap LGSIL without followup, Pap 2014 normal. Smokes several cigarettes daily.  Past medical history, past surgical history, family history and social history were all reviewed and documented in the EPIC chart. Works in Set [employer]. Paternal grandmother breast cancer, maternal grandmother heart disease  ROS:  A  12 point ROS was performed and pertinent positives and negatives are included.  Exam:  Filed Vitals:   03/03/14 0953  BP: 120/70    General appearance:  Normal Thyroid:  Symmetrical, normal in size, without palpable masses or nodularity. Respiratory  Auscultation:  Clear without wheezing or rhonchi Cardiovascular  Auscultation:  Regular rate, without rubs, murmurs or gallops  Edema/varicosities:  Not grossly evident Abdominal  Soft,nontender, without masses, guarding or rebound.  Liver/spleen:  No organomegaly noted  Hernia:  None appreciated  Skin  Inspection:  Grossly normal   Breasts: Examined lying and sitting.     Right: Without masses, retractions, discharge or axillary adenopathy.     Left: Without masses, retractions, discharge or axillary adenopathy. Gentitourinary   Inguinal/mons:  Normal without inguinal adenopathy  External genitalia:  Normal  BUS/Urethra/Skene's glands:  Normal  Vagina:  Minimal erythema -wet prep, positive for yeast  Cervix:  Normal  Uterus:   normal in size, shape and contour.  Midline and mobile  Adnexa/parametria:     Rt: Without masses or tenderness.   Lt: Without masses or tenderness.  Anus and perineum: Normal    Assessment/Plan:  23 y.o. SWF G0 for annual exam with no complaints.  2012 LGSIL with normal Paps after Smoker STD screen Yeast  Plan: NuvaRing prescription, proper use given and reviewed slight risk for blood clots and strokes. Reviewed  importance of no smoking. Condoms encouraged until permanent partner. Diflucan 150 by mouth x1 days with refills if needed. Yeast prevention discussed. SBE's, exercise, calcium rich diet, MVI daily encouraged. CBC, UA, Pap, GC/Chlamydia, HIV, hep B, C., RPR.   Note: This dictation was prepared with Dragon/digital dictation.  Any transcriptional errors that result are unintentional. Harrington Challenger [facility], 1:06 PM 03/03/2014

## 2014-03-04 LAB — URINALYSIS W MICROSCOPIC + REFLEX CULTURE
BILIRUBIN URINE: NEGATIVE
Casts: NONE SEEN
Crystals: NONE SEEN
Glucose, UA: NEGATIVE mg/dL
Hgb urine dipstick: NEGATIVE
KETONES UR: NEGATIVE mg/dL
Nitrite: NEGATIVE
PROTEIN: NEGATIVE mg/dL
Specific Gravity, Urine: 1.017 (ref 1.005–1.030)
UROBILINOGEN UA: 0.2 mg/dL (ref 0.0–1.0)
pH: 7.5 (ref 5.0–8.0)

## 2014-03-04 LAB — HIV ANTIBODY (ROUTINE TESTING W REFLEX): HIV 1&2 Ab, 4th Generation: NONREACTIVE

## 2014-03-04 LAB — RPR

## 2014-03-04 LAB — GC/CHLAMYDIA PROBE AMP
CT PROBE, AMP APTIMA: NEGATIVE
GC PROBE AMP APTIMA: NEGATIVE

## 2014-03-04 LAB — HEPATITIS C ANTIBODY: HCV Ab: NEGATIVE

## 2014-03-04 LAB — HEPATITIS B SURFACE ANTIGEN: HEP B S AG: NEGATIVE

## 2014-03-07 ENCOUNTER — Other Ambulatory Visit: Payer: Self-pay | Admitting: Women's Health

## 2014-03-07 LAB — URINE CULTURE: Colony Count: >=100000

## 2014-03-07 MED ORDER — CIPROFLOXACIN HCL 250 MG PO TABS: 250.0000 mg | ORAL_TABLET | Freq: Two times a day (BID) | ORAL | Status: DC

## 2014-03-08 ENCOUNTER — Telehealth: Payer: Self-pay

## 2014-03-08 DIAGNOSIS — R21 Rash and other nonspecific skin eruption: Secondary | ICD-10-CM

## 2014-03-08 MED ORDER — DOXYCYCLINE HYCLATE 100 MG PO TABS: 100.0000 mg | ORAL_TABLET | Freq: Two times a day (BID) | ORAL | Status: DC

## 2014-03-08 NOTE — Telephone Encounter (Signed)
PT STATES SHE HAVE A SPIDER OR TICK BITE ON HER RIGHT LEG AND SINCE SHE IS A FAMILY FRIEND OF DR Kenyon Ana, WOULD LIKE TO HAVE SOMETHING CALLED IN FOR IT PLEASE CALL PT AT [phone]   [facility] ON [address]

## 2014-06-14 ENCOUNTER — Ambulatory Visit (INDEPENDENT_AMBULATORY_CARE_PROVIDER_SITE_OTHER): Payer: BC Managed Care – PPO | Admitting: Women's Health

## 2014-06-14 ENCOUNTER — Encounter: Payer: Self-pay | Admitting: Women's Health

## 2014-06-14 DIAGNOSIS — N390 Urinary tract infection, site not specified: Secondary | ICD-10-CM

## 2014-06-14 LAB — WET PREP FOR TRICH, YEAST, CLUE
Clue Cells Wet Prep HPF POC: NONE SEEN
Trich, Wet Prep: NONE SEEN
Yeast Wet Prep HPF POC: NONE SEEN

## 2014-06-14 LAB — URINALYSIS W MICROSCOPIC + REFLEX CULTURE
CASTS: NONE SEEN
CRYSTALS: NONE SEEN
Glucose, UA: 100 mg/dL — AB
Nitrite: POSITIVE — AB
PH: 6 (ref 5.0–8.0)
Protein, ur: 100 mg/dL — AB
SPECIFIC GRAVITY, URINE: 1.01 (ref 1.005–1.030)
Urobilinogen, UA: 4 mg/dL — ABNORMAL HIGH (ref 0.0–1.0)

## 2014-06-14 MED ORDER — SULFAMETHOXAZOLE-TRIMETHOPRIM 800-160 MG PO TABS: 1.0000 | ORAL_TABLET | Freq: Two times a day (BID) | ORAL | Status: DC

## 2014-06-14 NOTE — Addendum Note (Signed)
Addended by: Berna Spare A on: 06/14/2014 02:52 PM   Modules accepted: Orders

## 2014-06-14 NOTE — Patient Instructions (Signed)

## 2014-06-14 NOTE — Progress Notes (Signed)
Patient ID: Lindsay Robles, female   DOB: 31-Mar-1991, 23 y.o.   MRN: [id number] Presents with complaint of urinary frequency, urgency, burning for one day. Denies fever, vaginal discharge. Contracepts with Nuva  ring, same partner. Took Azo with no relief.  Exam: Appears well. No CVAT. Ask diet within normal limits, speculum exam scant discharge wet prep negative.  UA: large  leukocytes, TNTC wbc's, trace blood, 3-6 RBCs. many bacteria  UTI  Plan: Septra twice daily for 3 days #6 prescription, proper use given and reviewed. Instructed to call if no relief. UTI prevention discussed.

## 2014-06-16 ENCOUNTER — Telehealth: Payer: Self-pay

## 2014-06-16 LAB — URINE CULTURE

## 2014-06-16 MED ORDER — CIPROFLOXACIN HCL 250 MG PO TABS: 250.0000 mg | ORAL_TABLET | Freq: Two times a day (BID) | ORAL | Status: DC

## 2014-06-16 NOTE — Telephone Encounter (Signed)
Patient said she was in Tuesday and diagnosed with UTI.  She said she has been taking her medicine ever since and is no better. Taking Azo twice a day as well.

## 2014-06-16 NOTE — Telephone Encounter (Signed)
Please call, culture is positive , sensitivity is not done yet, stop Septra and start Cipro 250 twice daily for 3 days.

## 2014-06-16 NOTE — Telephone Encounter (Signed)
Patient advised. Rx sent to pharmacy. 

## 2014-06-29 ENCOUNTER — Telehealth: Payer: Self-pay

## 2014-06-29 NOTE — Telephone Encounter (Signed)
Pt of Dr. Elbert Ewings , believes that he prescribed her tessalon pearls, and would like to know if she could get a refill on this to the [facility] on [address]. I did not see this on the med list.

## 2014-06-29 NOTE — Telephone Encounter (Signed)
Pt has not been in office since Feb. She needs to RTC. Pt advised.

## 2014-10-07 NOTE — L&D Delivery Note (Signed)
Patient was C/C/+2 and pushed for 1hr 45 minutes with epidural.   NSVD female infant, Apgars 9/9, weight pending. LOT with caput noted anterior frontal region of baby's head a delivery. The patient had a 2nd degree RML episiotomy repaired with 2-0 vircryl, anal sphinter muscle assessed and intact. Fundus was firm. EBL was expected amount. Placenta was delivered intact. Vagina was clear.  Baby was vigorous and doing skin to skin with mother.  Lindsay Robles

## 2014-10-12 ENCOUNTER — Encounter: Payer: Self-pay | Admitting: Women's Health

## 2014-10-12 ENCOUNTER — Ambulatory Visit (INDEPENDENT_AMBULATORY_CARE_PROVIDER_SITE_OTHER): Payer: BC Managed Care – PPO | Admitting: Women's Health

## 2014-10-12 VITALS — BP 122/78 | Ht 67.0 in | Wt 148.0 lb

## 2014-10-12 DIAGNOSIS — N9489 Other specified conditions associated with female genital organs and menstrual cycle: Secondary | ICD-10-CM

## 2014-10-12 DIAGNOSIS — N898 Other specified noninflammatory disorders of vagina: Secondary | ICD-10-CM

## 2014-10-12 DIAGNOSIS — N912 Amenorrhea, unspecified: Secondary | ICD-10-CM

## 2014-10-12 LAB — WET PREP FOR TRICH, YEAST, CLUE
CLUE CELLS WET PREP: NONE SEEN
TRICH WET PREP: NONE SEEN
YEAST WET PREP: NONE SEEN

## 2014-10-12 LAB — URINALYSIS W MICROSCOPIC + REFLEX CULTURE
BILIRUBIN URINE: NEGATIVE
Glucose, UA: NEGATIVE mg/dL
Hgb urine dipstick: NEGATIVE
Ketones, ur: NEGATIVE mg/dL
LEUKOCYTES UA: NEGATIVE
NITRITE: NEGATIVE
PH: 6 (ref 5.0–8.0)
Protein, ur: NEGATIVE mg/dL
SPECIFIC GRAVITY, URINE: 1.015 (ref 1.005–1.030)
Urobilinogen, UA: 0.2 mg/dL (ref 0.0–1.0)

## 2014-10-12 NOTE — Progress Notes (Signed)
Patient ID: Lindsay EdinHannah P Robles, female   DOB: 1991/09/21, 24 y.o.   MRN: [id number] Presents with vaginal odor x1 and pain during intercourse for the past 2-4 weeks with abdominal discomfort. Fishy odor starting last night after intercourse. No odor this morning. Denies discharge, itching, urinary symptoms.  Nuvaring not currently in. Worried about possible pregnancy due to not replacing Nuvaring after LMP. LMP 09/23/14, normal cycles. Same partner negative STD screen.  Exam: Well appearing. External genitalia  Erythematous labia. On speculum exam, scant semi translucent white discharge, normal cervix and vaginal walls. Bimanual no CMT, uterus small, no adnexal fullness or tenderness.  UA: Negative  Wet prep: Negative   Normal vaginal discharge Contraception management    Plan: Blood bHCG test. If not pregnant, replace Nuvaring after next cycle. Use condoms until next cycle and for the first month of the new Nuvaring. Call if symptoms of odor return or pain during intercourse persist.

## 2014-10-12 NOTE — Patient Instructions (Signed)

## 2014-10-13 LAB — HCG, QUANTITATIVE, PREGNANCY: hCG, Beta Chain, Quant, S: <2 m[IU]/mL

## 2014-12-08 ENCOUNTER — Ambulatory Visit: Payer: BC Managed Care – PPO | Admitting: Women's Health

## 2014-12-09 ENCOUNTER — Ambulatory Visit (INDEPENDENT_AMBULATORY_CARE_PROVIDER_SITE_OTHER): Payer: BC Managed Care – PPO | Admitting: Women's Health

## 2014-12-09 ENCOUNTER — Other Ambulatory Visit: Payer: Self-pay | Admitting: Women's Health

## 2014-12-09 ENCOUNTER — Ambulatory Visit (INDEPENDENT_AMBULATORY_CARE_PROVIDER_SITE_OTHER): Payer: BC Managed Care – PPO

## 2014-12-09 ENCOUNTER — Encounter: Payer: Self-pay | Admitting: Women's Health

## 2014-12-09 VITALS — BP 120/78 | Ht 67.0 in | Wt 154.0 lb

## 2014-12-09 DIAGNOSIS — O3680X0 Pregnancy with inconclusive fetal viability, not applicable or unspecified: Secondary | ICD-10-CM

## 2014-12-09 DIAGNOSIS — N912 Amenorrhea, unspecified: Secondary | ICD-10-CM

## 2014-12-09 LAB — PREGNANCY, URINE: PREG TEST UR: POSITIVE

## 2014-12-09 NOTE — Patient Instructions (Signed)
First Trimester of Pregnancy The first trimester of pregnancy is from week 1 until the end of week 12 (months 1 through 3). A week after a sperm fertilizes an egg, the egg will implant on the wall of the uterus. This embryo will begin to develop into a baby. Genes from you and your partner are forming the baby. The female genes determine whether the baby is a boy or a girl. At 6-8 weeks, the eyes and face are formed, and the heartbeat can be seen on ultrasound. At the end of 12 weeks, all the baby's organs are formed.  Now that you are pregnant, you will want to do everything you can to have a healthy baby. Two of the most important things are to get good prenatal care and to follow your health care provider's instructions. Prenatal care is all the medical care you receive before the baby's birth. This care will help prevent, find, and treat any problems during the pregnancy and childbirth. BODY CHANGES Your body goes through many changes during pregnancy. The changes vary from woman to woman.   You may gain or lose a couple of pounds at first.  You may feel sick to your stomach (nauseous) and throw up (vomit). If the vomiting is uncontrollable, call your health care provider.  You may tire easily.  You may develop headaches that can be relieved by medicines approved by your health care provider.  You may urinate more often. Painful urination may mean you have a bladder infection.  You may develop heartburn as a result of your pregnancy.  You may develop constipation because certain hormones are causing the muscles that push waste through your intestines to slow down.  You may develop hemorrhoids or swollen, bulging veins (varicose veins).  Your breasts may begin to grow larger and become tender. Your nipples may stick out more, and the tissue that surrounds them (areola) may become darker.  Your gums may bleed and may be sensitive to brushing and flossing.  Dark spots or blotches (chloasma,  mask of pregnancy) may develop on your face. This will likely fade after the baby is born.  Your menstrual periods will stop.  You may have a loss of appetite.  You may develop cravings for certain kinds of food.  You may have changes in your emotions from day to day, such as being excited to be pregnant or being concerned that something may go wrong with the pregnancy and baby.  You may have more vivid and strange dreams.  You may have changes in your hair. These can include thickening of your hair, rapid growth, and changes in texture. Some women also have hair loss during or after pregnancy, or hair that feels dry or thin. Your hair will most likely return to normal after your baby is born. WHAT TO EXPECT AT YOUR PRENATAL VISITS During a routine prenatal visit:  You will be weighed to make sure you and the baby are growing normally.  Your blood pressure will be taken.  Your abdomen will be measured to track your baby's growth.  The fetal heartbeat will be listened to starting around week 10 or 12 of your pregnancy.  Test results from any previous visits will be discussed. Your health care provider may ask you:  How you are feeling.  If you are feeling the baby move.  If you have had any abnormal symptoms, such as leaking fluid, bleeding, severe headaches, or abdominal cramping.  If you have any questions. Other tests   that may be performed during your first trimester include:  Blood tests to find your blood type and to check for the presence of any previous infections. They will also be used to check for low iron levels (anemia) and Rh antibodies. Later in the pregnancy, blood tests for diabetes will be done along with other tests if problems develop.  Urine tests to check for infections, diabetes, or protein in the urine.  An ultrasound to confirm the proper growth and development of the baby.  An amniocentesis to check for possible genetic problems.  Fetal screens for  spina bifida and Down syndrome.  You may need other tests to make sure you and the baby are doing well. HOME CARE INSTRUCTIONS  Medicines  Follow your health care provider's instructions regarding medicine use. Specific medicines may be either safe or unsafe to take during pregnancy.  Take your prenatal vitamins as directed.  If you develop constipation, try taking a stool softener if your health care provider approves. Diet  Eat regular, well-balanced meals. Choose a variety of foods, such as meat or vegetable-based protein, fish, milk and low-fat dairy products, vegetables, fruits, and whole grain breads and cereals. Your health care provider will help you determine the amount of weight gain that is right for you.  Avoid raw meat and uncooked cheese. These carry germs that can cause birth defects in the baby.  Eating four or five small meals rather than three large meals a day may help relieve nausea and vomiting. If you start to feel nauseous, eating a few soda crackers can be helpful. Drinking liquids between meals instead of during meals also seems to help nausea and vomiting.  If you develop constipation, eat more high-fiber foods, such as fresh vegetables or fruit and whole grains. Drink enough fluids to keep your urine clear or pale yellow. Activity and Exercise  Exercise only as directed by your health care provider. Exercising will help you:  Control your weight.  Stay in shape.  Be prepared for labor and delivery.  Experiencing pain or cramping in the lower abdomen or low back is a good sign that you should stop exercising. Check with your health care provider before continuing normal exercises.  Try to avoid standing for long periods of time. Move your legs often if you must stand in one place for a long time.  Avoid heavy lifting.  Wear low-heeled shoes, and practice good posture.  You may continue to have sex unless your health care provider directs you  otherwise. Relief of Pain or Discomfort  Wear a good support bra for breast tenderness.   Take warm sitz baths to soothe any pain or discomfort caused by hemorrhoids. Use hemorrhoid cream if your health care provider approves.   Rest with your legs elevated if you have leg cramps or low back pain.  If you develop varicose veins in your legs, wear support hose. Elevate your feet for 15 minutes, 3-4 times a day. Limit salt in your diet. Prenatal Care  Schedule your prenatal visits by the twelfth week of pregnancy. They are usually scheduled monthly at first, then more often in the last 2 months before delivery.  Write down your questions. Take them to your prenatal visits.  Keep all your prenatal visits as directed by your health care provider. Safety  Wear your seat belt at all times when driving.  Make a list of emergency phone numbers, including numbers for family, friends, the hospital, and police and fire departments. General Tips    Ask your health care provider for a referral to a local prenatal education class. Begin classes no later than at the beginning of month 6 of your pregnancy.  Ask for help if you have counseling or nutritional needs during pregnancy. Your health care provider can offer advice or refer you to specialists for help with various needs.  Do not use hot tubs, steam rooms, or saunas.  Do not douche or use tampons or scented sanitary pads.  Do not cross your legs for long periods of time.  Avoid cat litter boxes and soil used by cats. These carry germs that can cause birth defects in the baby and possibly loss of the fetus by miscarriage or stillbirth.  Avoid all smoking, herbs, alcohol, and medicines not prescribed by your health care provider. Chemicals in these affect the formation and growth of the baby.  Schedule a dentist appointment. At home, brush your teeth with a soft toothbrush and be gentle when you floss. SEEK MEDICAL CARE IF:   You have  dizziness.  You have mild pelvic cramps, pelvic pressure, or nagging pain in the abdominal area.  You have persistent nausea, vomiting, or diarrhea.  You have a bad smelling vaginal discharge.  You have pain with urination.  You notice increased swelling in your face, hands, legs, or ankles. SEEK IMMEDIATE MEDICAL CARE IF:   You have a fever.  You are leaking fluid from your vagina.  You have spotting or bleeding from your vagina.  You have severe abdominal cramping or pain.  You have rapid weight gain or loss.  You vomit blood or material that looks like coffee grounds.  You are exposed to German measles and have never had them.  You are exposed to fifth disease or chickenpox.  You develop a severe headache.  You have shortness of breath.  You have any kind of trauma, such as from a fall or a car accident. Document Released: 09/17/2001 Document Revised: 02/07/2014 Document Reviewed: 08/03/2013 [employer] Patient Information 2015 [employer]. This information is not intended to replace advice given to you by your health care provider. Make sure you discuss any questions you have with your health care provider.  

## 2014-12-09 NOTE — Progress Notes (Signed)
Patient ID: Lindsay Robles, female   DOB: [birth year], 24 y.o.   MRN: [id number] Presents with home positive UPT. LMP sometime in December had been on NuvaRing but did not get cycle in January, came to office, UPT negative, quantitative hCG less than 2 on  10/2014 instructed to place NuvaRing, condoms first month back on NuvaRing. Amenorrheic, breast tenderness.  Exam: Appears well. UPT positive Ultrasound: Intrauterine gestational sac seen. Fetal pole seen with fetal heart tones 171. Ovaries appear normal with corpus luteal cyst seen on right ovary. No free fluid seen. Transvaginal images. Ultrasound age 59 weeks 4 days.   Early pregnancy  Plan: Copy of ultrasound given, instructed to schedule prenatal care. Prenatal vitamin daily, samples given,, safe pregnancy behaviors reviewed.

## 2015-01-20 LAB — OB RESULTS CONSOLE HIV ANTIBODY (ROUTINE TESTING): HIV: NONREACTIVE

## 2015-01-20 LAB — OB RESULTS CONSOLE GC/CHLAMYDIA
CHLAMYDIA, DNA PROBE: NEGATIVE
GC PROBE AMP, GENITAL: NEGATIVE

## 2015-01-20 LAB — OB RESULTS CONSOLE HEPATITIS B SURFACE ANTIGEN: Hepatitis B Surface Ag: NEGATIVE

## 2015-01-20 LAB — OB RESULTS CONSOLE ABO/RH: RH Type: POSITIVE

## 2015-01-20 LAB — OB RESULTS CONSOLE ANTIBODY SCREEN: Antibody Screen: NEGATIVE

## 2015-01-20 LAB — OB RESULTS CONSOLE RPR: RPR: NONREACTIVE

## 2015-01-20 LAB — OB RESULTS CONSOLE RUBELLA ANTIBODY, IGM: Rubella: IMMUNE

## 2015-05-04 ENCOUNTER — Inpatient Hospital Stay (HOSPITAL_COMMUNITY)
Admission: AD | Admit: 2015-05-04 | Discharge: 2015-05-04 | Disposition: A | Discharge status: Home / Self Care | Payer: BC Managed Care – PPO | Source: Ambulatory Visit | Attending: Obstetrics and Gynecology | Admitting: Obstetrics and Gynecology

## 2015-05-04 ENCOUNTER — Encounter (HOSPITAL_COMMUNITY): Payer: Self-pay | Admitting: *Deleted

## 2015-05-04 DIAGNOSIS — Z3A30 30 weeks gestation of pregnancy: Secondary | ICD-10-CM | POA: Diagnosis not present

## 2015-05-04 DIAGNOSIS — Z87891 Personal history of nicotine dependence: Secondary | ICD-10-CM | POA: Insufficient documentation

## 2015-05-04 DIAGNOSIS — O98813 Other maternal infectious and parasitic diseases complicating pregnancy, third trimester: Secondary | ICD-10-CM | POA: Insufficient documentation

## 2015-05-04 DIAGNOSIS — O2343 Unspecified infection of urinary tract in pregnancy, third trimester: Secondary | ICD-10-CM | POA: Diagnosis not present

## 2015-05-04 DIAGNOSIS — B373 Candidiasis of vulva and vagina: Secondary | ICD-10-CM | POA: Diagnosis not present

## 2015-05-04 DIAGNOSIS — R300 Dysuria: Secondary | ICD-10-CM | POA: Diagnosis present

## 2015-05-04 DIAGNOSIS — B379 Candidiasis, unspecified: Secondary | ICD-10-CM

## 2015-05-04 HISTORY — DX: Headache, unspecified: R51.9

## 2015-05-04 HISTORY — DX: Unspecified infectious disease: B99.9

## 2015-05-04 HISTORY — DX: Headache: R51

## 2015-05-04 LAB — URINALYSIS, ROUTINE W REFLEX MICROSCOPIC
GLUCOSE, UA: 250 mg/dL — AB
Hgb urine dipstick: NEGATIVE
Ketones, ur: 15 mg/dL — AB
Nitrite: POSITIVE — AB
PH: 6.5 (ref 5.0–8.0)
PROTEIN: 100 mg/dL — AB
Specific Gravity, Urine: 1.02 (ref 1.005–1.030)

## 2015-05-04 LAB — WET PREP, GENITAL
Clue Cells Wet Prep HPF POC: NONE SEEN
TRICH WET PREP: NONE SEEN

## 2015-05-04 LAB — URINE MICROSCOPIC-ADD ON

## 2015-05-04 MED ORDER — CEPHALEXIN 500 MG PO CAPS: 500.0000 mg | ORAL_CAPSULE | Freq: Three times a day (TID) | ORAL | Status: DC

## 2015-05-04 MED ORDER — CLOTRIMAZOLE 2 % VA CREA: 1.0000 | TOPICAL_CREAM | Freq: Every day | VAGINAL | Status: DC

## 2015-05-04 MED ORDER — CLINDAMYCIN PHOSPHATE 2 % VA CREA: 1.0000 | TOPICAL_CREAM | Freq: Every day | VAGINAL | Status: DC

## 2015-05-04 NOTE — MAU Provider Note (Signed)
History   CSN: [id number] Arrival date and time: 05/04/15 1336  Chief Complaint  Patient presents with  . Dysuria   HPI Patient is 24 y.o. G1P0 [redacted]w[redacted]d here with complaints of dysuria  Started yesterday with intense itching and also feel labia swelling.  Last yeast infection was several months ago.  Last UTI was early in pregnancy and resolved with AZO otc Reports burning with urination, increased frequency.  Vaginal discharge is thick, reports she used a suppository last night.   +FM, denies LOF, VB, contractions, vaginal discharge.  PNC at [facility]- tried to call several times to try to get appt.  No complications this pregnancy  Past Medical History  Diagnosis Date  . Headache   . Infection     UTI- frequent    Past Surgical History  Procedure Laterality Date  . No past surgeries      Family History  Problem Relation Age of Onset  . Hypertension Father   . Breast cancer Paternal Grandmother   . Cancer Paternal Grandmother     History  Substance Use Topics  . Smoking status: Former Smoker -- 0.25 packs/day    Types: Cigarettes  . Smokeless tobacco: Never Used     Comment: quit with + pregnant  . Alcohol Use: 0.0 oz/week    0 Standard drinks or equivalent per week     Comment: none with preg   Allergies:  Allergies  Allergen Reactions  . Percocet [Oxycodone-Acetaminophen] Nausea And Vomiting   No prescriptions prior to admission    Review of Systems  Constitutional: Negative for fever and chills.  Eyes: Negative for blurred vision and double vision.  Respiratory: Negative for cough and shortness of breath.   Cardiovascular: Negative for chest pain and orthopnea.  Gastrointestinal: Negative for nausea and vomiting.  Genitourinary: Positive for dysuria, urgency and frequency. Negative for flank pain.  Musculoskeletal: Negative for myalgias.  Skin: Negative for rash.  Neurological: Negative for dizziness, tingling, weakness and headaches.   Endo/Heme/Allergies: Does not bruise/bleed easily.  Psychiatric/Behavioral: Negative for depression and suicidal ideas. The patient is not nervous/anxious.    Physical Exam   Blood pressure 123/82, pulse 85, temperature 97.5 F (36.4 C), temperature source Oral, resp. rate 18, last menstrual period 10/12/2014.  Physical Exam  Constitutional: She is oriented to person, place, and time. She appears well-developed and well-nourished. No distress.  Pregnant female  HENT:  Head: Normocephalic and atraumatic.  Eyes: Conjunctivae are normal. No scleral icterus.  Neck: Normal range of motion. Neck supple.  Cardiovascular: Normal rate and intact distal pulses.   Respiratory: Effort normal. She exhibits no tenderness.  GI: Soft. There is no tenderness. There is no rebound and no guarding.  Gravid  Genitourinary: Vagina normal.  Musculoskeletal: Normal range of motion. She exhibits no edema.  Neurological: She is alert and oriented to person, place, and time.  Skin: Skin is warm and dry. No rash noted.  Psychiatric: She has a normal mood and affect.    MAU Course  Procedures MDM UA Wet prep   FHR 130/+accels/no decels Toco quiet  Reviewed past labs.  Multiple UTIs- last in 9/82015 [facility]), has also grown klebsiella and GBS.  Ecoli species was pan sensitive except for bactrim. Klebsiella was pan sensitive except for ampicillin.   Assessment and Plan   Lindsay Robles is a 24 y.o. G1P0 at [redacted]w[redacted]d with sx of UTI and possible yeat infection.   # Suspected UTI:  treat UTI empirically and follow up cultures #  Wet prep with yeast- treat with terazol  Federico Flake 05/04/2015, 2:21 PM   1500 Care assumed by Rochele Pages, CNM  Lab Results Last 24 Hours   Results for orders placed or performed during the hospital encounter of 05/04/15 (from the past 24 hour(s))  Urinalysis, Routine w reflex microscopic (not at [facility]) Status: Abnormal   Collection Time: 05/04/15 2:00  PM  Result Value Ref Range   Color, Urine AMBER (A) YELLOW   APPearance CLOUDY (A) CLEAR   Specific Gravity, Urine 1.020 1.005 - 1.030   pH 6.5 5.0 - 8.0   Glucose, UA 250 (A) NEGATIVE mg/dL   Hgb urine dipstick NEGATIVE NEGATIVE   Bilirubin Urine SMALL (A) NEGATIVE   Ketones, ur 15 (A) NEGATIVE mg/dL   Protein, ur 161 (A) NEGATIVE mg/dL   Urobilinogen, UA >0.9 (H) 0.0 - 1.0 mg/dL   Nitrite POSITIVE (A) NEGATIVE   Leukocytes, UA LARGE (A) NEGATIVE  Urine microscopic-add on Status: Abnormal   Collection Time: 05/04/15 2:00 PM  Result Value Ref Range   Squamous Epithelial / LPF MANY (A) RARE   WBC, UA 3-6 <3 WBC/hpf   Bacteria, UA FEW (A) RARE   Urine-Other MUCOUS PRESENT   Wet prep, genital Status: Abnormal   Collection Time: 05/04/15 2:45 PM  Result Value Ref Range   Yeast Wet Prep HPF POC FEW (A) NONE SEEN   Trich, Wet Prep NONE SEEN NONE SEEN   Clue Cells Wet Prep HPF POC NONE SEEN NONE SEEN   WBC, Wet Prep HPF POC MODERATE (A) NONE SEEN     FHR 130's, +accels, reactive Toco - None  Assessment: 24 y.o. G1P0 at [redacted]w[redacted]d IUP  UTI in Pregnancy Yeast Infection  Plan: Discharge to home RX Keflex 500 mg TID x 7 days Urine culture to lab RX Terazol Keep scheduled appt in office  Marlis Edelson, CNM

## 2015-05-04 NOTE — MAU Note (Signed)
Pain with urination, started after her appt yesterday.  Tried OTC meds- not helping

## 2015-05-04 NOTE — MAU Note (Signed)
Used one day OTC suppository for yeast infection, still having itching and burning

## 2015-06-02 ENCOUNTER — Telehealth: Payer: Self-pay | Admitting: *Deleted

## 2015-06-02 NOTE — Telephone Encounter (Signed)
Telephone call, reviewed hCG was negative in January, positive UPT first of March. If pregnant at always very very early pregnant in January.

## 2015-06-02 NOTE — Telephone Encounter (Signed)
Lindsay Robles patient called with question about her HCG Quant level back in Jan 2016. [phone]

## 2015-06-02 NOTE — Telephone Encounter (Signed)
Pt called with questions, I left message for pt to call

## 2015-06-06 ENCOUNTER — Other Ambulatory Visit: Payer: Self-pay | Admitting: Obstetrics & Gynecology

## 2015-06-06 LAB — OB RESULTS CONSOLE GBS: GBS: POSITIVE

## 2015-06-15 ENCOUNTER — Encounter (HOSPITAL_COMMUNITY): Payer: Self-pay

## 2015-06-15 ENCOUNTER — Inpatient Hospital Stay (HOSPITAL_COMMUNITY)
Admission: AD | Admit: 2015-06-15 | Discharge: 2015-06-15 | Disposition: A | Discharge status: Home / Self Care | Payer: Medicaid Other | Source: Ambulatory Visit | Attending: Obstetrics | Admitting: Obstetrics

## 2015-06-15 DIAGNOSIS — O1213 Gestational proteinuria, third trimester: Secondary | ICD-10-CM | POA: Diagnosis not present

## 2015-06-15 DIAGNOSIS — Z87891 Personal history of nicotine dependence: Secondary | ICD-10-CM | POA: Insufficient documentation

## 2015-06-15 DIAGNOSIS — Z3A36 36 weeks gestation of pregnancy: Secondary | ICD-10-CM | POA: Insufficient documentation

## 2015-06-15 LAB — COMPREHENSIVE METABOLIC PANEL
ALT: 11 U/L — AB (ref 14–54)
ANION GAP: 9 (ref 5–15)
AST: 18 U/L (ref 15–41)
Albumin: 2.9 g/dL — ABNORMAL LOW (ref 3.5–5.0)
Alkaline Phosphatase: 105 U/L (ref 38–126)
BUN: 7 mg/dL (ref 6–20)
CO2: 21 mmol/L — AB (ref 22–32)
CREATININE: 0.56 mg/dL (ref 0.44–1.00)
Calcium: 8.3 mg/dL — ABNORMAL LOW (ref 8.9–10.3)
Chloride: 107 mmol/L (ref 101–111)
Glucose, Bld: 78 mg/dL (ref 65–99)
POTASSIUM: 4 mmol/L (ref 3.5–5.1)
SODIUM: 137 mmol/L (ref 135–145)
Total Bilirubin: 0.8 mg/dL (ref 0.3–1.2)
Total Protein: 6.5 g/dL (ref 6.5–8.1)

## 2015-06-15 LAB — PROTEIN / CREATININE RATIO, URINE
CREATININE, URINE: 148 mg/dL
Protein Creatinine Ratio: 0.11 mg/mg{Cre} (ref 0.00–0.15)
TOTAL PROTEIN, URINE: 17 mg/dL

## 2015-06-15 LAB — CBC
HCT: 33.6 % — ABNORMAL LOW (ref 36.0–46.0)
Hemoglobin: 11.2 g/dL — ABNORMAL LOW (ref 12.0–15.0)
MCH: 30.1 pg (ref 26.0–34.0)
MCHC: 33.3 g/dL (ref 30.0–36.0)
MCV: 90.3 fL (ref 78.0–100.0)
PLATELETS: 240 10*3/uL (ref 150–400)
RBC: 3.72 MIL/uL — ABNORMAL LOW (ref 3.87–5.11)
RDW: 13.7 % (ref 11.5–15.5)
WBC: 11.3 10*3/uL — ABNORMAL HIGH (ref 4.0–10.5)

## 2015-06-15 LAB — URIC ACID: URIC ACID, SERUM: 5.3 mg/dL (ref 2.3–6.6)

## 2015-06-15 LAB — LACTATE DEHYDROGENASE: LDH: 146 U/L (ref 98–192)

## 2015-06-15 NOTE — MAU Provider Note (Signed)
History     CSN: [id number]  Arrival date and time: 06/15/15 1600   None     Chief Complaint  Patient presents with  . Proteinuria   HPI  Lindsay Robles is a 24 y.o. G1P0 at [redacted]w[redacted]d who presents from office for BP evaluation d/t proteinuria in office today.  Denies headache, vision changes, or epigastric pain.  Denies LOF, vaginal bleeding, or contractions.  Denies chest pain or SOB.  Positive fetal movement.  No headahce vision changes epigastric pain No lof vb or ctx + fm No hx of htn Dt normal No clonus Mild edema in BLE  OB History    Gravida Para Term Preterm AB TAB SAB Ectopic Multiple Living   1               Past Medical History  Diagnosis Date  . Headache   . Infection     UTI- frequent    Past Surgical History  Procedure Laterality Date  . No past surgeries      Family History  Problem Relation Age of Onset  . Hypertension Father   . Breast cancer Paternal Grandmother   . Cancer Paternal Grandmother     Social History  Substance Use Topics  . Smoking status: Former Smoker -- 0.25 packs/day    Types: Cigarettes  . Smokeless tobacco: Never Used     Comment: quit with + pregnant  . Alcohol Use: 0.0 oz/week    0 Standard drinks or equivalent per week     Comment: none with preg    Allergies:  Allergies  Allergen Reactions  . Percocet [Oxycodone-Acetaminophen] Nausea And Vomiting    Prescriptions prior to admission  Medication Sig Dispense Refill Last Dose  . acetaminophen (TYLENOL) 500 MG tablet Take 1,000 mg by mouth every 6 (six) hours as needed for headache.   Past Week at Unknown time  . Prenatal Vit-Fe Fumarate-FA (PRENATAL MULTIVITAMIN) TABS tablet Take 2 tablets by mouth daily at 12 noon.    Past Week at Unknown time  . cephALEXin (KEFLEX) 500 MG capsule Take 1 capsule (500 mg total) by mouth 3 (three) times daily. (Patient not taking: Reported on 06/15/2015) 21 capsule 0 Completed Course at Unknown time  . clotrimazole  (GYNE-LOTRIMIN 3) 2 % vaginal cream Place 1 Applicatorful vaginally at bedtime. (Patient not taking: Reported on 06/15/2015) 21 g 0 Completed Course at Unknown time    Review of Systems  Constitutional: Negative.   Respiratory: Negative.   Cardiovascular: Negative.   Gastrointestinal: Negative.   Genitourinary: Negative.   Neurological: Negative.    Physical Exam   Blood pressure 139/65, pulse 78, temperature 98.2 F (36.8 C), temperature source Oral, resp. rate 18, height 5\' 7"  (1.702 m), weight 91.082 kg (200 lb 12.8 oz), last menstrual period 10/12/2014.   Patient Vitals for the past 24 hrs:  BP Temp Temp src Pulse Resp Height Weight  06/15/15 1720 128/72 mmHg - - 75 - - -  06/15/15 1700 125/78 mmHg - - 74 - - -  06/15/15 1646 128/71 mmHg - - 72 - - -  06/15/15 1629 139/65 mmHg - - 78 - - -  06/15/15 1618 137/78 mmHg 98.2 F (36.8 C) Oral 75 18 5\' 7"  (1.702 m) 91.082 kg (200 lb 12.8 oz)     Physical Exam  Nursing note and vitals reviewed. Constitutional: She is oriented to person, place, and time. She appears well-developed and well-nourished. No distress.  HENT:  Head: Normocephalic and atraumatic.  Eyes: Conjunctivae are normal. Right eye exhibits no discharge. Left eye exhibits no discharge. No scleral icterus.  Neck: Normal range of motion.  Cardiovascular: Normal rate, regular rhythm, normal heart sounds and intact distal pulses.   No murmur heard. Respiratory: Effort normal and breath sounds normal. No respiratory distress. She has no wheezes.  GI: Soft.  Musculoskeletal: She exhibits edema (mild edema in bilateral ankles & feet).  Neurological: She is alert and oriented to person, place, and time. She has normal reflexes.  No clonus  Skin: Skin is warm and dry. She is not diaphoretic.  Psychiatric: She has a normal mood and affect. Her behavior is normal. Judgment and thought content normal.   Fetal Tracing:  Baseline: 140 Variability: moderate Accelerations:  15x15 Decelerations: none  Toco: irregular contractions    MAU Course  Procedures Results for orders placed or performed during the hospital encounter of 06/15/15 (from the past 24 hour(s))  Protein / creatinine ratio, urine     Status: None   Collection Time: 06/15/15  4:20 PM  Result Value Ref Range   Creatinine, Urine 148.00 mg/dL   Total Protein, Urine 17 mg/dL   Protein Creatinine Ratio 0.11 0.00 - 0.15 mg/mg[Cre]  CBC     Status: Abnormal   Collection Time: 06/15/15  4:33 PM  Result Value Ref Range   WBC 11.3 (H) 4.0 - 10.5 K/uL   RBC 3.72 (L) 3.87 - 5.11 MIL/uL   Hemoglobin 11.2 (L) 12.0 - 15.0 g/dL   HCT 16.1 (L) 09.6 - 04.5 %   MCV 90.3 78.0 - 100.0 fL   MCH 30.1 26.0 - 34.0 pg   MCHC 33.3 30.0 - 36.0 g/dL   RDW 40.9 81.1 - 91.4 %   Platelets 240 150 - 400 K/uL  Comprehensive metabolic panel     Status: Abnormal   Collection Time: 06/15/15  4:33 PM  Result Value Ref Range   Sodium 137 135 - 145 mmol/L   Potassium 4.0 3.5 - 5.1 mmol/L   Chloride 107 101 - 111 mmol/L   CO2 21 (L) 22 - 32 mmol/L   Glucose, Bld 78 65 - 99 mg/dL   BUN 7 6 - 20 mg/dL   Creatinine, Ser 7.82 0.44 - 1.00 mg/dL   Calcium 8.3 (L) 8.9 - 10.3 mg/dL   Total Protein 6.5 6.5 - 8.1 g/dL   Albumin 2.9 (L) 3.5 - 5.0 g/dL   AST 18 15 - 41 U/L   ALT 11 (L) 14 - 54 U/L   Alkaline Phosphatase 105 38 - 126 U/L   Total Bilirubin 0.8 0.3 - 1.2 mg/dL   GFR calc non Af Amer >60 >60 mL/min   GFR calc Af Amer >60 >60 mL/min   Anion gap 9 5 - 15  Uric acid     Status: None   Collection Time: 06/15/15  4:33 PM  Result Value Ref Range   Uric Acid, Serum 5.3 2.3 - 6.6 mg/dL  Lactate dehydrogenase     Status: None   Collection Time: 06/15/15  4:33 PM  Result Value Ref Range   LDH 146 98 - 192 U/L    MDM Category 1 tracing No elevated BPs while in MAU 1731- C/w Dr. Chestine Spore. Discussed BP & lab results. Discharge home  Assessment and Plan  A: 1. Proteinuria affecting pregnancy in third trimester     P: Discharge home Keep appointment for prenatal care Discusses s/s pre eclampsia  Judeth Horn, NP  06/15/2015, 4:42 PM

## 2015-06-15 NOTE — MAU Note (Signed)
Pt sent from offic for 2+ protein in her urine. Had dizzyness on Sunday. B/P WNL in office.

## 2015-06-15 NOTE — Discharge Instructions (Signed)

## 2015-06-23 ENCOUNTER — Encounter (HOSPITAL_COMMUNITY): Payer: Self-pay | Admitting: *Deleted

## 2015-06-23 ENCOUNTER — Inpatient Hospital Stay (HOSPITAL_COMMUNITY)
Admission: AD | Admit: 2015-06-23 | Discharge: 2015-06-23 | Disposition: A | Discharge status: Home / Self Care | Payer: Medicaid Other | Source: Ambulatory Visit | Attending: Obstetrics and Gynecology | Admitting: Obstetrics and Gynecology

## 2015-06-23 DIAGNOSIS — Z3A37 37 weeks gestation of pregnancy: Secondary | ICD-10-CM | POA: Insufficient documentation

## 2015-06-23 DIAGNOSIS — O9989 Other specified diseases and conditions complicating pregnancy, childbirth and the puerperium: Secondary | ICD-10-CM | POA: Diagnosis not present

## 2015-06-23 DIAGNOSIS — Z87891 Personal history of nicotine dependence: Secondary | ICD-10-CM | POA: Diagnosis not present

## 2015-06-23 DIAGNOSIS — O10913 Unspecified pre-existing hypertension complicating pregnancy, third trimester: Secondary | ICD-10-CM | POA: Insufficient documentation

## 2015-06-23 DIAGNOSIS — R51 Headache: Secondary | ICD-10-CM | POA: Diagnosis not present

## 2015-06-23 DIAGNOSIS — R0989 Other specified symptoms and signs involving the circulatory and respiratory systems: Secondary | ICD-10-CM

## 2015-06-23 DIAGNOSIS — R519 Headache, unspecified: Secondary | ICD-10-CM

## 2015-06-23 LAB — URINALYSIS, ROUTINE W REFLEX MICROSCOPIC
Bilirubin Urine: NEGATIVE
GLUCOSE, UA: NEGATIVE mg/dL
KETONES UR: 15 mg/dL — AB
Nitrite: NEGATIVE
PROTEIN: NEGATIVE mg/dL
Specific Gravity, Urine: 1.025 (ref 1.005–1.030)
Urobilinogen, UA: 0.2 mg/dL (ref 0.0–1.0)
pH: 6 (ref 5.0–8.0)

## 2015-06-23 LAB — CBC WITH DIFFERENTIAL/PLATELET
BASOS PCT: 0 %
Basophils Absolute: 0 10*3/uL (ref 0.0–0.1)
EOS ABS: 0.1 10*3/uL (ref 0.0–0.7)
EOS PCT: 1 %
HCT: 33.2 % — ABNORMAL LOW (ref 36.0–46.0)
Hemoglobin: 10.9 g/dL — ABNORMAL LOW (ref 12.0–15.0)
Lymphocytes Relative: 17 %
Lymphs Abs: 1.9 10*3/uL (ref 0.7–4.0)
MCH: 29.5 pg (ref 26.0–34.0)
MCHC: 32.8 g/dL (ref 30.0–36.0)
MCV: 90 fL (ref 78.0–100.0)
Monocytes Absolute: 1.1 10*3/uL — ABNORMAL HIGH (ref 0.1–1.0)
Monocytes Relative: 9 %
NEUTROS PCT: 73 %
Neutro Abs: 8.6 10*3/uL — ABNORMAL HIGH (ref 1.7–7.7)
PLATELETS: 247 10*3/uL (ref 150–400)
RBC: 3.69 MIL/uL — ABNORMAL LOW (ref 3.87–5.11)
RDW: 13.8 % (ref 11.5–15.5)
WBC: 11.6 10*3/uL — ABNORMAL HIGH (ref 4.0–10.5)

## 2015-06-23 LAB — POCT FERN TEST: POCT Fern Test: NEGATIVE

## 2015-06-23 LAB — URINE MICROSCOPIC-ADD ON

## 2015-06-23 LAB — COMPREHENSIVE METABOLIC PANEL
ALBUMIN: 2.9 g/dL — AB (ref 3.5–5.0)
ALT: 10 U/L — ABNORMAL LOW (ref 14–54)
ANION GAP: 11 (ref 5–15)
AST: 21 U/L (ref 15–41)
Alkaline Phosphatase: 110 U/L (ref 38–126)
BUN: 10 mg/dL (ref 6–20)
CO2: 21 mmol/L — AB (ref 22–32)
Calcium: 8.7 mg/dL — ABNORMAL LOW (ref 8.9–10.3)
Chloride: 103 mmol/L (ref 101–111)
Creatinine, Ser: 0.63 mg/dL (ref 0.44–1.00)
GFR calc Af Amer: >60 mL/min (ref >60)
GFR calc non Af Amer: >60 mL/min (ref >60)
GLUCOSE: 112 mg/dL — AB (ref 65–99)
POTASSIUM: 3.7 mmol/L (ref 3.5–5.1)
Sodium: 135 mmol/L (ref 135–145)
Total Bilirubin: 0.6 mg/dL (ref 0.3–1.2)
Total Protein: 6.3 g/dL — ABNORMAL LOW (ref 6.5–8.1)

## 2015-06-23 LAB — PROTEIN / CREATININE RATIO, URINE
Creatinine, Urine: 146 mg/dL
PROTEIN CREATININE RATIO: 0.09 mg/mg{creat} (ref 0.00–0.15)
TOTAL PROTEIN, URINE: 13 mg/dL

## 2015-06-23 MED ORDER — BUTALBITAL-APAP-CAFFEINE 50-325-40 MG PO CAPS: 1.0000 | ORAL_CAPSULE | Freq: Four times a day (QID) | ORAL | Status: DC | PRN

## 2015-06-23 NOTE — Discharge Instructions (Signed)

## 2015-06-23 NOTE — MAU Note (Signed)
Patient states noticing a "gush of clear fluid" around 1pm this afternoon.  She denies LOF now or any vaginal bleeding.  Reports +fetal movement.  Also states having sharp right lower quadrant pain earlier today, but not now.  She also had a headache at work today and checked her BP which was 152/92.  Upon arrival to MAU, BP was 126/90. Pt voided prior to arrival, unable to get urine specimen at this time.

## 2015-06-23 NOTE — MAU Provider Note (Signed)
Chief Complaint:  Headache; Hypertension; Abdominal Pain; and Rupture of Membranes   First Provider Initiated Contact with Patient 06/23/15 1712      HPI: Lindsay Robles is a 24 y.o. G1P0 at 90w4dwho presents to maternity admissions reporting gush of fluid, headache and elevated BP at work today.  States the fluid soaked through her clothing. Had some pain earlier but none now. Has had some intermittent hypertension but "it goes away when I get here" She reports good fetal movement, and denies vaginal bleeding, vaginal itching/burning, urinary symptoms,  dizziness, n/v, or fever/chills.    RN Note: Patient states noticing a "gush of clear fluid" around 1pm this afternoon. She denies LOF now or any vaginal bleeding. Reports +fetal movement. Also states having sharp right lower quadrant pain earlier today, but not now. She also had a headache at work today and checked her BP which was 152/92. Upon arrival to MAU, BP was 126/90. Pt voided prior to arrival, unable to get urine specimen at this time.          Headache  This is a recurrent problem. The current episode started today. The problem occurs intermittently. The problem has been gradually improving. The pain is located in the bilateral and frontal region. The pain does not radiate. The pain quality is similar to prior headaches. The quality of the pain is described as aching. The pain is mild. Pertinent negatives include no abdominal pain, back pain, blurred vision, dizziness, fever, numbness, photophobia, tingling, visual change, vomiting or weakness. Nothing aggravates the symptoms. She has tried acetaminophen for the symptoms. The treatment provided no relief.   See above  Past Medical History: Past Medical History  Diagnosis Date  . Headache   . Infection     UTI- frequent    Past obstetric history: OB History  Gravida Para Term Preterm AB SAB TAB Ectopic Multiple Living  1             # Outcome Date GA Lbr Len/2nd  Weight Sex Delivery Anes PTL Lv  1 Current               Past Surgical History: Past Surgical History  Procedure Laterality Date  . No past surgeries      Family History: Family History  Problem Relation Age of Onset  . Hypertension Father   . Breast cancer Paternal Grandmother   . Cancer Paternal Grandmother     Social History: Social History  Substance Use Topics  . Smoking status: Former Smoker -- 0.25 packs/day    Types: Cigarettes  . Smokeless tobacco: Never Used     Comment: quit with + pregnant  . Alcohol Use: 0.0 oz/week    0 Standard drinks or equivalent per week     Comment: none with preg    Allergies:  Allergies  Allergen Reactions  . Percocet [Oxycodone-Acetaminophen] Nausea And Vomiting    Meds:  Prescriptions prior to admission  Medication Sig Dispense Refill Last Dose  . acetaminophen (TYLENOL) 500 MG tablet Take 1,000 mg by mouth every 6 (six) hours as needed for headache.   06/23/2015 at Unknown time  . diphenhydrAMINE (BENADRYL) 25 MG tablet Take 12.5 mg by mouth at bedtime as needed for sleep.   06/22/2015 at Unknown time  . Prenatal Vit-Fe Fumarate-FA (PRENATAL MULTIVITAMIN) TABS tablet Take 2 tablets by mouth daily at 12 noon.    06/22/2015 at Unknown time   Medical, Surgical, Family and Social histories reviewed and are listed above.  Medications and allergies reviewed.   ROS:  Review of Systems  Constitutional: Negative for fever.  Eyes: Negative for blurred vision, photophobia and visual disturbance.  Respiratory: Negative for shortness of breath.   Gastrointestinal: Negative for vomiting and abdominal pain.  Genitourinary: Negative for dysuria and vaginal bleeding.  Musculoskeletal: Negative for back pain.  Neurological: Positive for headaches. Negative for dizziness, tingling, speech difficulty, weakness and numbness.     Physical Exam  Patient Vitals for the past 24 hrs:  BP Temp Temp src Pulse Resp SpO2 Height Weight  06/23/15  1624 126/80 mmHg 97.7 F (36.5 C) Oral 96 18 98 %  (1.702 m) 203 lb (92.08 kg)   Filed Vitals:   06/23/15 1852 06/23/15 1857 06/23/15 1900 06/23/15 1902  BP:   129/79   Pulse: 86 84 84 84  Temp:   98.4 F (36.9 C)   TempSrc:   Oral   Resp:   18   Height:      Weight:      SpO2: 98% 98% 98%   136/88 131/83 133/80 135/79  125/83 127/78  Constitutional: Well-developed, well-nourished female in no acute distress.  Cardiovascular: normal rate and rhythm Respiratory: normal effort, lungs clear bilaterally GI: Abd soft, non-tender, gravid appropriate for gestational age. No RUQ tenderness MS: Extremities nontender, no edema, normal ROM Neurologic: Alert and oriented x 4. DTRs 2+ with no clonus GU: Neg CVAT.  PELVIC EXAM: Cervix pink, visually closed, without lesion, scant white creamy discharge, vaginal walls and external genitalia normal No pooling of fluid, fern negative Dilation: 3 Effacement (%): 50 Station: -3 Exam by:: Wynelle Bourgeois, CNM    FHT:  Baseline 140 , moderate variability, accelerations present, no decelerations Contractions: Irregular and mild   Labs:    Results for orders placed or performed during the hospital encounter of 06/23/15 (from the past 24 hour(s))  Urinalysis, Routine w reflex microscopic (not at [facility])     Status: Abnormal   Collection Time: 06/23/15  5:30 PM  Result Value Ref Range   Color, Urine YELLOW YELLOW   APPearance CLEAR CLEAR   Specific Gravity, Urine 1.025 1.005 - 1.030   pH 6.0 5.0 - 8.0   Glucose, UA NEGATIVE NEGATIVE mg/dL   Hgb urine dipstick LARGE (A) NEGATIVE   Bilirubin Urine NEGATIVE NEGATIVE   Ketones, ur 15 (A) NEGATIVE mg/dL   Protein, ur NEGATIVE NEGATIVE mg/dL   Urobilinogen, UA 0.2 0.0 - 1.0 mg/dL   Nitrite NEGATIVE NEGATIVE   Leukocytes, UA TRACE (A) NEGATIVE  Protein / creatinine ratio, urine     Status: None   Collection Time: 06/23/15  5:30 PM  Result Value Ref Range   Creatinine, Urine 146.00 mg/dL    Total Protein, Urine 13 mg/dL   Protein Creatinine Ratio 0.09 0.00 - 0.15 mg/mg[Cre]  Urine microscopic-add on     Status: Abnormal   Collection Time: 06/23/15  5:30 PM  Result Value Ref Range   Squamous Epithelial / LPF FEW (A) RARE   WBC, UA 0-2 <3 WBC/hpf   RBC / HPF 11-20 <3 RBC/hpf   Bacteria, UA MANY (A) RARE  Fern Test     Status: None   Collection Time: 06/23/15  5:37 PM  Result Value Ref Range   POCT Fern Test Negative = intact amniotic membranes   CBC with Differential/Platelet     Status: Abnormal   Collection Time: 06/23/15  6:20 PM  Result Value Ref Range   WBC 11.6 (H) 4.0 -  10.5 K/uL   RBC 3.69 (L) 3.87 - 5.11 MIL/uL   Hemoglobin 10.9 (L) 12.0 - 15.0 g/dL   HCT 16.1 (L) 09.6 - 04.5 %   MCV 90.0 78.0 - 100.0 fL   MCH 29.5 26.0 - 34.0 pg   MCHC 32.8 30.0 - 36.0 g/dL   RDW 40.9 81.1 - 91.4 %   Platelets 247 150 - 400 K/uL   Neutrophils Relative % 73 %   Neutro Abs 8.6 (H) 1.7 - 7.7 K/uL   Lymphocytes Relative 17 %   Lymphs Abs 1.9 0.7 - 4.0 K/uL   Monocytes Relative 9 %   Monocytes Absolute 1.1 (H) 0.1 - 1.0 K/uL   Eosinophils Relative 1 %   Eosinophils Absolute 0.1 0.0 - 0.7 K/uL   Basophils Relative 0 %   Basophils Absolute 0.0 0.0 - 0.1 K/uL  Comprehensive metabolic panel     Status: Abnormal   Collection Time: 06/23/15  6:20 PM  Result Value Ref Range   Sodium 135 135 - 145 mmol/L   Potassium 3.7 3.5 - 5.1 mmol/L   Chloride 103 101 - 111 mmol/L   CO2 21 (L) 22 - 32 mmol/L   Glucose, Bld 112 (H) 65 - 99 mg/dL   BUN 10 6 - 20 mg/dL   Creatinine, Ser 7.82 0.44 - 1.00 mg/dL   Calcium 8.7 (L) 8.9 - 10.3 mg/dL   Total Protein 6.3 (L) 6.5 - 8.1 g/dL   Albumin 2.9 (L) 3.5 - 5.0 g/dL   AST 21 15 - 41 U/L   ALT 10 (L) 14 - 54 U/L   Alkaline Phosphatase 110 38 - 126 U/L   Total Bilirubin 0.6 0.3 - 1.2 mg/dL   GFR calc non Af Amer >60 >60 mL/min   GFR calc Af Amer >60 >60 mL/min   Anion gap 11 5 - 15    Imaging:  No results found.  MAU Course/MDM: I  have ordered labs and reviewed results to rule out preeclampsia    Consult Dr Claiborne Billings regarding presentation, exam findings and results.    Treatments in MAU included lab testing.  Pt stable at time of discharge.  Assessment: Single IUP at [redacted]w[redacted]d  Labile hypertension with normal blood pressures here No evidence for preeclampsia, labs normal Headache with history of migraine-type headaches, unrelieved by Tylenol   Plan: Discharge home per consult Dr Claiborne Billings Preeclampsia precautions Labor precautions and fetal kick counts Rx Fioricet for headache Followup in office for prenatal visit next week or PRN here if more symptoms develop    Medication List    ASK your doctor about these medications        acetaminophen 500 MG tablet  Commonly known as:  TYLENOL  Take 1,000 mg by mouth every 6 (six) hours as needed for headache.     diphenhydrAMINE 25 MG tablet  Commonly known as:  BENADRYL  Take 12.5 mg by mouth at bedtime as needed for sleep.     prenatal multivitamin Tabs tablet  Take 2 tablets by mouth daily at 12 noon.        Wynelle Bourgeois CNM, MSN Certified Nurse-Midwife 06/23/2015 5:12 PM

## 2015-07-04 ENCOUNTER — Inpatient Hospital Stay (HOSPITAL_COMMUNITY): Payer: Medicaid Other | Admitting: Anesthesiology

## 2015-07-04 ENCOUNTER — Inpatient Hospital Stay (HOSPITAL_COMMUNITY)
Admission: AD | Admit: 2015-07-04 | Discharge: 2015-07-06 | DRG: 775 | Disposition: A | Discharge status: Home / Self Care | Payer: Medicaid Other | Source: Ambulatory Visit | Attending: Obstetrics and Gynecology | Admitting: Obstetrics and Gynecology

## 2015-07-04 ENCOUNTER — Encounter (HOSPITAL_COMMUNITY): Payer: Self-pay

## 2015-07-04 DIAGNOSIS — O99824 Streptococcus B carrier state complicating childbirth: Secondary | ICD-10-CM | POA: Diagnosis present

## 2015-07-04 DIAGNOSIS — R51 Headache: Secondary | ICD-10-CM | POA: Diagnosis present

## 2015-07-04 DIAGNOSIS — Z3A39 39 weeks gestation of pregnancy: Secondary | ICD-10-CM | POA: Diagnosis present

## 2015-07-04 DIAGNOSIS — Z87891 Personal history of nicotine dependence: Secondary | ICD-10-CM

## 2015-07-04 DIAGNOSIS — O4292 Full-term premature rupture of membranes, unspecified as to length of time between rupture and onset of labor: Principal | ICD-10-CM | POA: Diagnosis present

## 2015-07-04 LAB — CBC
HCT: 34.9 % — ABNORMAL LOW (ref 36.0–46.0)
Hemoglobin: 11.5 g/dL — ABNORMAL LOW (ref 12.0–15.0)
MCH: 29.6 pg (ref 26.0–34.0)
MCHC: 33 g/dL (ref 30.0–36.0)
MCV: 89.7 fL (ref 78.0–100.0)
Platelets: 232 10*3/uL (ref 150–400)
RBC: 3.89 MIL/uL (ref 3.87–5.11)
RDW: 14.2 % (ref 11.5–15.5)
WBC: 11.9 10*3/uL — ABNORMAL HIGH (ref 4.0–10.5)

## 2015-07-04 LAB — COMPREHENSIVE METABOLIC PANEL
ALT: 9 U/L — ABNORMAL LOW (ref 14–54)
ANION GAP: 10 (ref 5–15)
AST: 20 U/L (ref 15–41)
Albumin: 3 g/dL — ABNORMAL LOW (ref 3.5–5.0)
Alkaline Phosphatase: 125 U/L (ref 38–126)
BUN: 8 mg/dL (ref 6–20)
CHLORIDE: 107 mmol/L (ref 101–111)
CO2: 20 mmol/L — ABNORMAL LOW (ref 22–32)
Calcium: 8.5 mg/dL — ABNORMAL LOW (ref 8.9–10.3)
Creatinine, Ser: 0.65 mg/dL (ref 0.44–1.00)
Glucose, Bld: 89 mg/dL (ref 65–99)
POTASSIUM: 4.1 mmol/L (ref 3.5–5.1)
Sodium: 137 mmol/L (ref 135–145)
TOTAL PROTEIN: 6.3 g/dL — AB (ref 6.5–8.1)
Total Bilirubin: 0.6 mg/dL (ref 0.3–1.2)

## 2015-07-04 LAB — TYPE AND SCREEN
ABO/RH(D): O POS
Antibody Screen: NEGATIVE

## 2015-07-04 LAB — ABO/RH: ABO/RH(D): O POS

## 2015-07-04 LAB — URIC ACID: URIC ACID, SERUM: 5.9 mg/dL (ref 2.3–6.6)

## 2015-07-04 LAB — LACTATE DEHYDROGENASE: LDH: 154 U/L (ref 98–192)

## 2015-07-04 LAB — POCT FERN TEST: POCT Fern Test: POSITIVE

## 2015-07-04 MED ORDER — DIPHENHYDRAMINE HCL 25 MG PO CAPS: 25.0000 mg | ORAL_CAPSULE | Freq: Four times a day (QID) | ORAL | Status: DC | PRN

## 2015-07-04 MED ORDER — SENNOSIDES-DOCUSATE SODIUM 8.6-50 MG PO TABS
2.0000 | ORAL_TABLET | ORAL | Status: DC
  Administered 2015-07-04 – 2015-07-05 (×2): 2 via ORAL
  Filled 2015-07-04 (×2): qty 2

## 2015-07-04 MED ORDER — LACTATED RINGERS IV SOLN
INTRAVENOUS | Status: DC
  Administered 2015-07-04: 04:00:00 via INTRAVENOUS
  Administered 2015-07-04: 1000 mL via INTRAVENOUS

## 2015-07-04 MED ORDER — EPHEDRINE 5 MG/ML INJ: 10.0000 mg | INTRAVENOUS | Status: DC | PRN

## 2015-07-04 MED ORDER — FENTANYL 2.5 MCG/ML BUPIVACAINE 1/10 % EPIDURAL INFUSION (WH - ANES)
14.0000 mL/h | INTRAMUSCULAR | Status: DC | PRN
Start: 2015-07-04 — End: 2015-07-04
  Administered 2015-07-04 (×2): 14 mL/h via EPIDURAL
  Filled 2015-07-04 (×2): qty 125

## 2015-07-04 MED ORDER — TETANUS-DIPHTH-ACELL PERTUSSIS 5-2.5-18.5 LF-MCG/0.5 IM SUSP: 0.5000 mL | Freq: Once | INTRAMUSCULAR | Status: DC

## 2015-07-04 MED ORDER — OXYTOCIN BOLUS FROM INFUSION: 500.0000 mL | INTRAVENOUS | Status: DC

## 2015-07-04 MED ORDER — BUPIVACAINE HCL (PF) 0.25 % IJ SOLN
INTRAMUSCULAR | Status: DC | PRN
  Administered 2015-07-04 (×2): 4 mL via EPIDURAL

## 2015-07-04 MED ORDER — HYDROCODONE-ACETAMINOPHEN 5-325 MG PO TABS
1.0000 | ORAL_TABLET | ORAL | Status: DC | PRN
  Administered 2015-07-04 – 2015-07-05 (×4): 1 via ORAL
  Filled 2015-07-04 (×4): qty 1

## 2015-07-04 MED ORDER — WITCH HAZEL-GLYCERIN EX PADS: 1.0000 "application " | MEDICATED_PAD | CUTANEOUS | Status: DC | PRN

## 2015-07-04 MED ORDER — CITRIC ACID-SODIUM CITRATE 334-500 MG/5ML PO SOLN: 30.0000 mL | ORAL | Status: DC | PRN

## 2015-07-04 MED ORDER — OXYCODONE-ACETAMINOPHEN 5-325 MG PO TABS: 1.0000 | ORAL_TABLET | ORAL | Status: DC | PRN

## 2015-07-04 MED ORDER — LIDOCAINE HCL (PF) 1 % IJ SOLN
30.0000 mL | INTRAMUSCULAR | Status: DC | PRN
  Administered 2015-07-04: 30 mL via SUBCUTANEOUS
  Filled 2015-07-04: qty 30

## 2015-07-04 MED ORDER — ACETAMINOPHEN 325 MG PO TABS: 650.0000 mg | ORAL_TABLET | ORAL | Status: DC | PRN

## 2015-07-04 MED ORDER — ZOLPIDEM TARTRATE 5 MG PO TABS: 5.0000 mg | ORAL_TABLET | Freq: Every evening | ORAL | Status: DC | PRN

## 2015-07-04 MED ORDER — DIBUCAINE 1 % RE OINT
1.0000 "application " | TOPICAL_OINTMENT | RECTAL | Status: DC | PRN
  Administered 2015-07-04: 1 via RECTAL
  Filled 2015-07-04: qty 28

## 2015-07-04 MED ORDER — IBUPROFEN 600 MG PO TABS
600.0000 mg | ORAL_TABLET | Freq: Four times a day (QID) | ORAL | Status: DC
  Administered 2015-07-04 – 2015-07-06 (×8): 600 mg via ORAL
  Filled 2015-07-04 (×8): qty 1

## 2015-07-04 MED ORDER — DIPHENHYDRAMINE HCL 50 MG/ML IJ SOLN: 12.5000 mg | INTRAMUSCULAR | Status: DC | PRN

## 2015-07-04 MED ORDER — LANOLIN HYDROUS EX OINT: TOPICAL_OINTMENT | CUTANEOUS | Status: DC | PRN

## 2015-07-04 MED ORDER — OXYTOCIN 40 UNITS IN LACTATED RINGERS INFUSION - SIMPLE MED
62.5000 mL/h | INTRAVENOUS | Status: DC
  Administered 2015-07-04: 62.5 mL/h via INTRAVENOUS
  Filled 2015-07-04: qty 1000

## 2015-07-04 MED ORDER — PENICILLIN G POTASSIUM 5000000 UNITS IJ SOLR
5.0000 10*6.[IU] | Freq: Once | INTRAVENOUS | Status: AC
  Administered 2015-07-04: 5 10*6.[IU] via INTRAVENOUS
  Filled 2015-07-04: qty 5

## 2015-07-04 MED ORDER — SIMETHICONE 80 MG PO CHEW: 80.0000 mg | CHEWABLE_TABLET | ORAL | Status: DC | PRN

## 2015-07-04 MED ORDER — FLEET ENEMA 7-19 GM/118ML RE ENEM
1.0000 | ENEMA | RECTAL | Status: DC | PRN
Start: 2015-07-04 — End: 2015-07-04

## 2015-07-04 MED ORDER — TERBUTALINE SULFATE 1 MG/ML IJ SOLN: 0.2500 mg | Freq: Once | INTRAMUSCULAR | Status: DC | PRN

## 2015-07-04 MED ORDER — LACTATED RINGERS IV SOLN: 500.0000 mL | INTRAVENOUS | Status: DC | PRN

## 2015-07-04 MED ORDER — ONDANSETRON HCL 4 MG/2ML IJ SOLN: 4.0000 mg | INTRAMUSCULAR | Status: DC | PRN

## 2015-07-04 MED ORDER — OXYTOCIN 40 UNITS IN LACTATED RINGERS INFUSION - SIMPLE MED
1.0000 m[IU]/min | INTRAVENOUS | Status: DC
  Administered 2015-07-04: 2 m[IU]/min via INTRAVENOUS

## 2015-07-04 MED ORDER — ONDANSETRON HCL 4 MG PO TABS: 4.0000 mg | ORAL_TABLET | ORAL | Status: DC | PRN

## 2015-07-04 MED ORDER — LIDOCAINE-EPINEPHRINE (PF) 2 %-1:200000 IJ SOLN
INTRAMUSCULAR | Status: DC | PRN
  Administered 2015-07-04: 4 mL

## 2015-07-04 MED ORDER — PENICILLIN G POTASSIUM 5000000 UNITS IJ SOLR
2.5000 10*6.[IU] | INTRAMUSCULAR | Status: DC
  Administered 2015-07-04 (×2): 2.5 10*6.[IU] via INTRAVENOUS
  Filled 2015-07-04 (×6): qty 2.5

## 2015-07-04 MED ORDER — ONDANSETRON HCL 4 MG/2ML IJ SOLN
4.0000 mg | Freq: Four times a day (QID) | INTRAMUSCULAR | Status: DC | PRN
  Administered 2015-07-04: 4 mg via INTRAVENOUS
  Filled 2015-07-04: qty 2

## 2015-07-04 MED ORDER — BENZOCAINE-MENTHOL 20-0.5 % EX AERO
1.0000 "application " | INHALATION_SPRAY | CUTANEOUS | Status: DC | PRN
  Administered 2015-07-04: 1 via TOPICAL
  Filled 2015-07-04 (×2): qty 56

## 2015-07-04 MED ORDER — PHENYLEPHRINE 40 MCG/ML (10ML) SYRINGE FOR IV PUSH (FOR BLOOD PRESSURE SUPPORT)
80.0000 ug | PREFILLED_SYRINGE | INTRAVENOUS | Status: DC | PRN
  Filled 2015-07-04: qty 20

## 2015-07-04 MED ORDER — OXYCODONE-ACETAMINOPHEN 5-325 MG PO TABS: 2.0000 | ORAL_TABLET | ORAL | Status: DC | PRN

## 2015-07-04 MED ORDER — PRENATAL MULTIVITAMIN CH
1.0000 | ORAL_TABLET | Freq: Every day | ORAL | Status: DC
Start: 2015-07-05 — End: 2015-07-06
  Administered 2015-07-05 – 2015-07-06 (×2): 1 via ORAL
  Filled 2015-07-04 (×2): qty 1

## 2015-07-04 NOTE — Plan of Care (Signed)
Problem: Consults Goal: Birthing Suites Patient Information Press F2 to bring up selections list   Pt 37-[redacted] weeks EGA     

## 2015-07-04 NOTE — MAU Note (Signed)
Report called to [facility] on BS. Will go to 162 after IV and labs

## 2015-07-04 NOTE — MAU Note (Signed)
Pt presents complaining of contractions every and a gush of fluid at 0100. Denies vaginal bleeding. Reports good fetal movement.

## 2015-07-04 NOTE — Anesthesia Preprocedure Evaluation (Signed)
Anesthesia Evaluation  Patient identified by MRN, date of birth, ID band Patient awake    Reviewed: Allergy & Precautions, NPO status , Patient's Chart, lab work & pertinent test results  History of Anesthesia Complications Negative for: history of anesthetic complications  Airway Mallampati: II  TM Distance: >3 FB Neck ROM: Full    Dental  (+) Teeth Intact   Pulmonary neg shortness of breath, neg sleep apnea, neg COPD, neg recent URI, former smoker, neg PE   breath sounds clear to auscultation       Cardiovascular negative cardio ROS   Rhythm:Regular     Neuro/Psych  Headaches, negative psych ROS   GI/Hepatic negative GI ROS, Neg liver ROS,   Endo/Other  negative endocrine ROS  Renal/GU negative Renal ROS     Musculoskeletal   Abdominal   Peds  Hematology negative hematology ROS (+)   Anesthesia Other Findings   Reproductive/Obstetrics (+) Pregnancy                             Anesthesia Physical Anesthesia Plan  ASA: II  Anesthesia Plan: Epidural   Post-op Pain Management:    Induction:   Airway Management Planned:   Additional Equipment:   Intra-op Plan:   Post-operative Plan:   Informed Consent: I have reviewed the patients History and Physical, chart, labs and discussed the procedure including the risks, benefits and alternatives for the proposed anesthesia with the patient or authorized representative who has indicated his/her understanding and acceptance.     Plan Discussed with: Anesthesiologist  Anesthesia Plan Comments:         Anesthesia Quick Evaluation

## 2015-07-04 NOTE — Anesthesia Procedure Notes (Signed)
Epidural Patient location during procedure: OB  Staffing Anesthesiologist: Oliwia Berzins Performed by: anesthesiologist   Preanesthetic Checklist Completed: patient identified, surgical consent, pre-op evaluation, timeout performed, IV checked, risks and benefits discussed and monitors and equipment checked  Epidural Patient position: sitting Prep: DuraPrep Patient monitoring: heart rate, cardiac monitor, continuous pulse ox and blood pressure Approach: midline Location: L3-L4 Injection technique: LOR saline  Needle:  Needle type: Tuohy  Needle gauge: 17 G Needle length: 9 cm Needle insertion depth: 7 cm Catheter type: closed end flexible Catheter size: 19 Gauge Catheter at skin depth: 13 cm Test dose: negative and 2% lidocaine with Epi 1:200 K  Assessment Events: blood not aspirated, injection not painful, no injection resistance, negative IV test and no paresthesia  Additional Notes Reason for block:procedure for pain   

## 2015-07-04 NOTE — H&P (Signed)
24 y.o. [redacted]w[redacted]d  G1P0 comes in c/o SROM/labor.  Otherwise has good fetal movement and no bleeding.  Past Medical History  Diagnosis Date  . Headache   . Infection     UTI- frequent    Past Surgical History  Procedure Laterality Date  . No past surgeries      OB History  Gravida Para Term Preterm AB SAB TAB Ectopic Multiple Living  1             # Outcome Date GA Lbr Len/2nd Weight Sex Delivery Anes PTL Lv  1 Current               Social History   Social History  . Marital Status: Single    Spouse Name: N/A  . Number of Children: N/A  . Years of Education: N/A   Occupational History  . Not on file.   Social History Main Topics  . Smoking status: Former Smoker -- 0.25 packs/day    Types: Cigarettes  . Smokeless tobacco: Never Used     Comment: quit with + pregnant  . Alcohol Use: 0.0 oz/week    0 Standard drinks or equivalent per week     Comment: none with preg  . Drug Use: No  . Sexual Activity:    Partners: Male     Comment: NUVARING, intercourse age 54, sexual partners more than 5   Other Topics Concern  . Not on file   Social History Narrative   Percocet    Prenatal Transfer Tool  Maternal Diabetes: No Genetic Screening: Normal Maternal Ultrasounds/Referrals: Normal Fetal Ultrasounds or other Referrals:  None Maternal Substance Abuse:  No Significant Maternal Medications:  None Significant Maternal Lab Results: Lab values include: Group B Strep positive  Other PNC: uncomplicated.  [place] for S<D at 30 weeks showing 82%, repeat scan at 36 weeks show BPD >98%, otherwise normal size 3038g 6#11    Filed Vitals:   07/04/15 1230  BP: 121/26  Pulse: 108  Temp:   Resp: 16     Lungs/Cor:  NAD Abdomen:  soft, gravid Ex:  no cords, erythema SVE:  Now 10 FHTs:  135, good STV, NST R Toco:  q 1-5   A/P   Admitted with SROM  GBS Pos - PCN  Normal progression of labor curve  Routine care  [place], SIDNEY

## 2015-07-05 LAB — CBC
HCT: 30.4 % — ABNORMAL LOW (ref 36.0–46.0)
Hemoglobin: 9.8 g/dL — ABNORMAL LOW (ref 12.0–15.0)
MCH: 29.1 pg (ref 26.0–34.0)
MCHC: 32.2 g/dL (ref 30.0–36.0)
MCV: 90.2 fL (ref 78.0–100.0)
PLATELETS: 207 10*3/uL (ref 150–400)
RBC: 3.37 MIL/uL — ABNORMAL LOW (ref 3.87–5.11)
RDW: 14.6 % (ref 11.5–15.5)
WBC: 13.7 10*3/uL — ABNORMAL HIGH (ref 4.0–10.5)

## 2015-07-05 LAB — RPR: RPR Ser Ql: NONREACTIVE

## 2015-07-05 NOTE — Progress Notes (Signed)
UR chart review completed.  

## 2015-07-05 NOTE — Lactation Note (Signed)
This note was copied from the chart of Lindsay Keirah Konitzer. Lactation Consultation Note  Initial Consultation for G1 mom at 24 hours old. Baby is asleep in dads lap. Baby has had 6 BF and 3 attempts, 4 voids and 5 stools in last 24 hours. Mom reports she feels BF is going well. She reports some nipple soreness with latch that improves. Gave Comfort Gels and instructions for use. Mom has a DEBP that she brought from home. She asked how to set up and about pumping for returning to work. Questions answered. Discussed BF basics, feed 8-12 x in 24 hours at first feeding cues, nipple care, pumping and introduction of bottles, and I/O requirements. Referred parents to BF info in Taking Care of Baby and Me. Gave LC Brochure and informed them of OP services, Support Groups and BF Resources. Told to call for questions/concerns.  Patient Name: Lindsay Robles Today's Date: 07/05/2015 Reason for consult: Initial assessment   Maternal Data Formula Feeding for Exclusion: No  Feeding    LATCH Score/Interventions                      Lactation Tools Discussed/Used Pump Review: Setup, frequency, and cleaning;Milk Storage Initiated by:: Noralee Stain, RN, IBCLC Date initiated:: 07/05/15   Consult Status Consult Status: Follow-up    Ed Blalock 07/05/2015, 3:44 PM

## 2015-07-05 NOTE — Anesthesia Postprocedure Evaluation (Signed)
Anesthesia Post Note  Patient: Lindsay Robles  Procedure(s) Performed: * No procedures listed *  Anesthesia type: Epidural  Patient location: Mother/Baby  Post pain: Pain level controlled  Post assessment: Post-op Vital signs reviewed  Last Vitals:  Filed Vitals:   07/05/15 0602  BP: 128/70  Pulse: 79  Temp: 36.9 C  Resp: 18    Post vital signs: Reviewed  Level of consciousness: awake  Complications: No apparent anesthesia complications

## 2015-07-06 ENCOUNTER — Ambulatory Visit: Payer: Self-pay

## 2015-07-06 MED ORDER — HYDROCODONE-ACETAMINOPHEN 5-325 MG PO TABS: 1.0000 | ORAL_TABLET | ORAL | Status: DC | PRN

## 2015-07-06 NOTE — Discharge Summary (Signed)
Obstetric Discharge Summary Reason for Admission: rupture of membranes Prenatal Procedures: ultrasound Intrapartum Procedures: spontaneous vaginal delivery Postpartum Procedures: none Complications-Operative and Postpartum: Rignt medial lateral episiotomy HEMOGLOBIN  Date Value Ref Range Status  07/05/2015 9.8* 12.0 - 15.0 g/dL Final   HCT  Date Value Ref Range Status  07/05/2015 30.4* 36.0 - 46.0 % Final    Physical Exam:  General: alert Lochia: appropriate Uterine Fundus: firm   Discharge Diagnoses: Term Pregnancy-delivered  Discharge Information: Date: 07/06/2015 Activity: pelvic rest Diet: routine Medications: PNV, Ibuprofen and Vicodin Condition: stable Instructions: refer to practice specific booklet Discharge to: home Follow-up Information    Follow up with CALLAHAN, SIDNEY, DO. Schedule an appointment as soon as possible for a visit in 1 month.   Specialty:  Obstetrics and Gynecology   Contact information:   [address] [place] [address] [phone]       Newborn Data: Live born female  Birth Weight: 7 lb 9.2 oz (3436 g) APGAR: 9, 9  Home with mother.  ANDERSON,MARK E 07/06/2015, 8:47 AM

## 2015-07-06 NOTE — Lactation Note (Signed)
This note was copied from the chart of Lindsay Robles. Lactation Consultation Note  Patient Name: Lindsay Robles [id number] Date: 07/06/2015 Reason for consult: Follow-up assessment;Breast/nipple pain  Baby is 45 hours old, Voiding and stooling qs for age. Bili check WNL . Baby had been breast feeding consistently until this  Am, and per mom to sore to latch , so she had been pumping with a DEBP. And pumping was feeling better. LC assessed breast tissue with moms permission and noted The nipples to be pinky red, no breakdown noted, semi compressible areolas, and steady flow of  Milk when hand expressed . Breast are warm , and fuller in appearance, milk s coming in .  LC reassured mom that is a good sign.  Sore nipple and engorgement prevention and tx reviewed - LC added breast shells ( inverted ) to take  Care of swollen areolas alternating with comfort gels . Per mom has a DEBP at home.  LC recommended to mom if soreness isn't improved by 4 days to call [facility] office for [facility] apt .  Protect and establish milk supply . Mom had pumped off 15 ml of EBM .  Mother informed of post-discharge support and given phone number to the lactation department, including services for phone call assistance; out-patient appointments; and breastfeeding support group. List of other breastfeeding resources in the community given in the handout. Encouraged mother to call for problems or concerns related to breastfeeding.   Maternal Data Has patient been taught Hand Expression?: Yes Does the patient have breastfeeding experience prior to this delivery?: No  Feeding Feeding Type: Breast Milk Nipple Type: Slow - flow  LATCH Score/Interventions                      Lactation Tools Discussed/Used Tools: Shells;Pump;Comfort gels Shell Type: Inverted Breast pump type: Double-Electric Breast Pump   Consult Status Consult Status: Complete Date: 07/06/15    Kathrin Greathouse 07/06/2015, 12:32 PM

## 2015-07-06 NOTE — Progress Notes (Signed)
PPD#2 Pt without complaints. Breastfeeding going well.  VSSAF IMP/ Stable Plan/ Will discharge.

## 2016-06-11 ENCOUNTER — Ambulatory Visit (HOSPITAL_COMMUNITY)
Admission: EM | Admit: 2016-06-11 | Discharge: 2016-06-11 | Disposition: A | Discharge status: Home / Self Care | Payer: Medicaid Other | Attending: Family Medicine | Admitting: Family Medicine

## 2016-06-11 ENCOUNTER — Encounter (HOSPITAL_COMMUNITY): Payer: Self-pay | Admitting: *Deleted

## 2016-06-11 DIAGNOSIS — R519 Headache, unspecified: Secondary | ICD-10-CM

## 2016-06-11 DIAGNOSIS — R51 Headache: Secondary | ICD-10-CM

## 2016-06-11 MED ORDER — IPRATROPIUM BROMIDE 0.06 % NA SOLN: 2.0000 | Freq: Four times a day (QID) | NASAL | 1 refills | Status: DC

## 2016-06-11 NOTE — ED Provider Notes (Signed)
[facility]    CSN: [id number] Arrival date & time: 06/11/16  1158  First Provider Contact:  First MD Initiated Contact with Patient 06/11/16 1240        History   Chief Complaint Chief Complaint  Patient presents with  . Headache    HPI Lindsay Robles is a 25 y.o. female.   The history is provided by the patient.  Headache  Pain location:  Frontal Quality:  Dull Radiates to:  Does not radiate Onset quality:  Gradual Duration:  1 week Chronicity:  New Similar to prior headaches: no   Context comment:  Concern about possible mold exposure at friends house. Relieved by:  None tried Worsened by:  Nothing Ineffective treatments:  None tried Associated symptoms: congestion, dizziness and drainage   Associated symptoms: no blurred vision, no fever, no focal weakness, no hearing loss, no loss of balance and no sore throat   Risk factors comment:  Smoker 5cigs/day.   Past Medical History:  Diagnosis Date  . Headache   . Infection    UTI- frequent    Patient Active Problem List   Diagnosis Date Noted  . Normal labor 07/04/2015  . Light smoker 03/03/2014    Past Surgical History:  Procedure Laterality Date  . NO PAST SURGERIES      OB History    Gravida Para Term Preterm AB Living   1 1 1     1    SAB TAB Ectopic Multiple Live Births         0 1       Home Medications    Prior to Admission medications   Medication Sig Start Date End Date Taking? Authorizing Provider  HYDROcodone-acetaminophen (NORCO/VICODIN) 5-325 MG tablet Take 1-2 tablets by mouth every 4 (four) hours as needed for severe pain (per patient, dose not tolerated percoecet, does tolerate vicodin). 07/06/15   Levi Aland, MD  Prenatal Vit-Fe Fumarate-FA (PRENATAL MULTIVITAMIN) TABS tablet Take 2 tablets by mouth daily at 12 noon.     Historical Provider, MD    Family History Family History  Problem Relation Age of Onset  . Hypertension Father   . Breast cancer Paternal  Grandmother   . Cancer Paternal Grandmother     Social History Social History  Substance Use Topics  . Smoking status: Former Smoker    Packs/day: 0.25    Types: Cigarettes  . Smokeless tobacco: Never Used     Comment: quit with + pregnant  . Alcohol use 0.0 oz/week     Comment: none with preg     Allergies   Percocet [oxycodone-acetaminophen]   Review of Systems Review of Systems  Constitutional: Positive for appetite change. Negative for fever.  HENT: Positive for congestion and postnasal drip. Negative for hearing loss and sore throat.   Eyes: Negative.  Negative for blurred vision.  Respiratory: Negative.  Negative for shortness of breath and wheezing.   Neurological: Positive for dizziness and headaches. Negative for focal weakness and loss of balance.     Physical Exam Triage Vital Signs ED Triage Vitals [06/11/16 1230]  Enc Vitals Group     BP 122/76     Pulse Rate 102     Resp 16     Temp 99.6 F (37.6 C)     Temp Source Oral     SpO2 99 %     Weight      Height      Head Circumference  Peak Flow      Pain Score      Pain Loc      Pain Edu?      Excl. in GC?    No data found.   Updated Vital Signs BP 122/76 (BP Location: Right Arm)   Pulse 102   Temp 99.6 F (37.6 C) (Oral)   Resp 16   LMP 06/11/2016   SpO2 99%   Breastfeeding? No   Visual Acuity Right Eye Distance:   Left Eye Distance:   Bilateral Distance:    Right Eye Near:   Left Eye Near:    Bilateral Near:     Physical Exam  Constitutional: She appears well-developed and well-nourished.  HENT:  Head: Normocephalic.  Right Ear: External ear normal.  Left Ear: External ear normal.  Mouth/Throat: Oropharynx is clear and moist.  Eyes: Conjunctivae and EOM are normal. Pupils are equal, round, and reactive to light.  Neck: Normal range of motion.  Cardiovascular: Normal rate and regular rhythm.   Pulmonary/Chest: Effort normal and breath sounds normal.  Lymphadenopathy:      She has no cervical adenopathy.  Nursing note and vitals reviewed.    UC Treatments / Results  Labs (all labs ordered are listed, but only abnormal results are displayed) Labs Reviewed - No data to display  EKG  EKG Interpretation None       Radiology No results found.  Procedures Procedures (including critical care time)  Medications Ordered in UC Medications - No data to display   Initial Impression / Assessment and Plan / UC Course  I have reviewed the triage vital signs and the nursing notes.  Pertinent labs & imaging results that were available during my care of the patient were reviewed by me and considered in my medical decision making (see chart for details).  Clinical Course      Final Clinical Impressions(s) / UC Diagnoses   Final diagnoses:  None    New Prescriptions New Prescriptions   No medications on file     Linna HoffJames D Jaikob Borgwardt, MD 06/11/16 1251

## 2016-06-11 NOTE — ED Notes (Signed)
Pt  Wants   Something  For  Headache     Computer        Down  So  Dr  Artis Flockkindl wrote    rx for   toradol

## 2016-06-11 NOTE — Discharge Instructions (Signed)
Use saline wash 2 times a day for 1 week, stop smoking, use prescription, see your doctor as needed.

## 2016-06-11 NOTE — ED Triage Notes (Signed)
Pt  Reports  sev  Days  Of    Congested    Headache   As  Well  As  Being  Dizzy      She  States  She     [place]  [place]  [facility]  [facility]  [place]  With possible  Mold       She  Is  Awake  And  Alert  And  Oriented  And  Appears  In no  Acute /  Severe  Distress      Sitting  Upright on the  Exam table  Speaking in  Complete  sentances

## 2016-08-15 ENCOUNTER — Other Ambulatory Visit: Payer: Self-pay | Admitting: Obstetrics and Gynecology

## 2017-01-01 ENCOUNTER — Encounter (HOSPITAL_COMMUNITY): Payer: Self-pay | Admitting: *Deleted

## 2017-01-01 ENCOUNTER — Emergency Department (HOSPITAL_COMMUNITY)
Admission: EM | Admit: 2017-01-01 | Discharge: 2017-01-01 | Disposition: A | Discharge status: Home / Self Care | Payer: Medicaid Other | Attending: Emergency Medicine | Admitting: Emergency Medicine

## 2017-01-01 ENCOUNTER — Ambulatory Visit (HOSPITAL_COMMUNITY)
Admission: EM | Admit: 2017-01-01 | Discharge: 2017-01-01 | Disposition: A | Discharge status: Home / Self Care | Payer: Medicaid Other

## 2017-01-01 DIAGNOSIS — Z87891 Personal history of nicotine dependence: Secondary | ICD-10-CM | POA: Insufficient documentation

## 2017-01-01 DIAGNOSIS — Z79899 Other long term (current) drug therapy: Secondary | ICD-10-CM | POA: Insufficient documentation

## 2017-01-01 DIAGNOSIS — R509 Fever, unspecified: Secondary | ICD-10-CM | POA: Diagnosis present

## 2017-01-01 DIAGNOSIS — J111 Influenza due to unidentified influenza virus with other respiratory manifestations: Secondary | ICD-10-CM | POA: Insufficient documentation

## 2017-01-01 LAB — BASIC METABOLIC PANEL
ANION GAP: 9 (ref 5–15)
BUN: 6 mg/dL (ref 6–20)
CO2: 20 mmol/L — ABNORMAL LOW (ref 22–32)
Calcium: 9.1 mg/dL (ref 8.9–10.3)
Chloride: 107 mmol/L (ref 101–111)
Creatinine, Ser: 0.73 mg/dL (ref 0.44–1.00)
GFR calc Af Amer: >60 mL/min (ref >60)
GLUCOSE: 90 mg/dL (ref 65–99)
POTASSIUM: 4 mmol/L (ref 3.5–5.1)
SODIUM: 136 mmol/L (ref 135–145)

## 2017-01-01 LAB — CBC
HCT: 40.5 % (ref 36.0–46.0)
HEMOGLOBIN: 13.5 g/dL (ref 12.0–15.0)
MCH: 30.2 pg (ref 26.0–34.0)
MCHC: 33.3 g/dL (ref 30.0–36.0)
MCV: 90.6 fL (ref 78.0–100.0)
Platelets: 268 10*3/uL (ref 150–400)
RBC: 4.47 MIL/uL (ref 3.87–5.11)
RDW: 12.8 % (ref 11.5–15.5)
WBC: 8.2 10*3/uL (ref 4.0–10.5)

## 2017-01-01 LAB — I-STAT BETA HCG BLOOD, ED (MC, WL, AP ONLY): I-stat hCG, quantitative: <5 m[IU]/mL (ref <5)

## 2017-01-01 NOTE — ED Notes (Signed)
Patient complaining of sore throat, lethargy, headache, body aches, fever, chills, vomiting, nausea, and stomach cramps. Denies diarrhea.

## 2017-01-01 NOTE — Discharge Instructions (Signed)
Take tylenol 2 pills 4 times a day and motrin 4 pills 3 times a day.  Drink plenty of fluids.  Return for worsening shortness of breath, headache, confusion. Follow up with your family doctor.   

## 2017-01-01 NOTE — ED Triage Notes (Signed)
Pt reports onset today of feeling weak, lightheaded, lower abd cramping, headache and sore throat. Pt thought it was possibly related to her birth control. No acute distress is noted at triage.

## 2017-01-01 NOTE — ED Provider Notes (Signed)
[facility]-EMERGENCY DEPT Provider Note   CSN: [id number] Arrival date & time: 01/01/17  1437   By signing my name below, I, Teofilo PodMatthew P. Jamison, attest that this documentation has been prepared under the direction and in the presence of Melene Planan Tarik Teixeira, DO . Electronically Signed: Teofilo PodMatthew P. Jamison, ED Scribe. 01/01/2017. 5:57 PM.   History   Chief Complaint Chief Complaint  Patient presents with  . Fatigue  . Headache     The history is provided by the patient. No language interpreter was used.   HPI Comments:  Lindsay Robles is a 26 y.o. female who presents to the Emergency Department complaining of constant fever x 1 day. Pt has a fever of 100.3 at the ED. Pt complains of associated diaphoresis, generalized body aches, abdominal pain, vomiting, cough, congestion. Pt reports multiple sick contacts. Denies hx of asthma. No alleviating factors noted. Pt denies diarrhea.  Past Medical History:  Diagnosis Date  . Headache   . Infection    UTI- frequent    Patient Active Problem List   Diagnosis Date Noted  . Normal labor 07/04/2015  . Light smoker 03/03/2014    Past Surgical History:  Procedure Laterality Date  . NO PAST SURGERIES      OB History    Gravida Para Term Preterm AB Living   1 1 1     1    SAB TAB Ectopic Multiple Live Births         0 1       Home Medications    Prior to Admission medications   Medication Sig Start Date End Date Taking? Authorizing Provider  HYDROcodone-acetaminophen (NORCO/VICODIN) 5-325 MG tablet Take 1-2 tablets by mouth every 4 (four) hours as needed for severe pain (per patient, dose not tolerated percoecet, does tolerate vicodin). 07/06/15   Levi AlandMark E Anderson, MD  ipratropium (ATROVENT) 0.06 % nasal spray Place 2 sprays into both nostrils 4 (four) times daily. 06/11/16   Linna HoffJames D Kindl, MD  Prenatal Vit-Fe Fumarate-FA (PRENATAL MULTIVITAMIN) TABS tablet Take 2 tablets by mouth daily at 12 noon.     Historical Provider, MD    Family  History Family History  Problem Relation Age of Onset  . Hypertension Father   . Breast cancer Paternal Grandmother   . Cancer Paternal Grandmother     Social History Social History  Substance Use Topics  . Smoking status: Former Smoker    Packs/day: 0.25    Types: Cigarettes  . Smokeless tobacco: Never Used     Comment: quit with + pregnant  . Alcohol use 0.0 oz/week     Comment: none with preg     Allergies   Percocet [oxycodone-acetaminophen]   Review of Systems Review of Systems  Constitutional: Positive for diaphoresis and fever.  HENT: Positive for congestion.   Respiratory: Positive for cough.   Gastrointestinal: Positive for abdominal pain and vomiting. Negative for diarrhea.  Musculoskeletal: Positive for myalgias.  All other systems reviewed and are negative.    Physical Exam Updated Vital Signs BP 116/67 (BP Location: Left Arm)   Pulse (!) 108   Temp 100.3 F (37.9 C) (Oral)   Resp 19   Ht 5\' 7"  (1.702 m)   Wt 145 lb (65.8 kg)   LMP 12/04/2016   SpO2 98%   BMI 22.71 kg/m   Physical Exam  Constitutional: She is oriented to person, place, and time. She appears well-developed and well-nourished. No distress.  Warm to touch  HENT:  Head: Normocephalic  and atraumatic.  Swollen turbinates, postnasal drip. TMs normal.   Eyes: EOM are normal. Pupils are equal, round, and reactive to light.  Neck: Normal range of motion. Neck supple.  Cardiovascular: Normal rate and regular rhythm.  Exam reveals no gallop and no friction rub.   No murmur heard. Pulmonary/Chest: Effort normal and breath sounds normal. She has no wheezes. She has no rales.  Abdominal: Soft. She exhibits no distension. There is no tenderness.  Musculoskeletal: She exhibits no edema or tenderness.  Neurological: She is alert and oriented to person, place, and time.  Skin: Skin is warm and dry. She is not diaphoretic.  Psychiatric: She has a normal mood and affect. Her behavior is normal.   Nursing note and vitals reviewed.    ED Treatments / Results  DIAGNOSTIC STUDIES:  Oxygen Saturation is 98% on RA, normal by my interpretation.    COORDINATION OF CARE:  5:50 PM Discussed treatment plan with pt at bedside and pt agreed to plan.   Labs (all labs ordered are listed, but only abnormal results are displayed) Labs Reviewed  BASIC METABOLIC PANEL - Abnormal; Notable for the following:       Result Value   CO2 20 (*)    All other components within normal limits  CBC  I-STAT BETA HCG BLOOD, ED ([facility], [facility], [facility] ONLY)    EKG  EKG Interpretation None       Radiology No results found.  Procedures Procedures (including critical care time)  Medications Ordered in ED Medications - No data to display   Initial Impression / Assessment and Plan / ED Course  I have reviewed the triage vital signs and the nursing notes.  Pertinent labs & imaging results that were available during my care of the patient were reviewed by me and considered in my medical decision making (see chart for details).     27 yo F With an influenza-like illness. Going on just today. Well-appearing and nontoxic no bacterial source found on exam. Discussed risks and benefits of Tamiflu currently declining. Discharge home.  :  I have discussed the diagnosis/risks/treatment options with the patient and family and believe the pt to be eligible for discharge home to follow-up with PCP. We also discussed returning to the ED immediately if new or worsening sx occur. We discussed the sx which are most concerning (e.g., sudden worsening pain, fever, inability to tolerate by mouth) that necessitate immediate return. Medications administered to the patient during their visit and any new prescriptions provided to the patient are listed below.  Medications given during this visit Medications - No data to display   The patient appears reasonably screen and/or stabilized for discharge and I doubt any other  medical condition or other [facility] requiring further screening, evaluation, or treatment in the ED at this time prior to discharge.    Final Clinical Impressions(s) / ED Diagnoses   Final diagnoses:  Influenza    New Prescriptions Discharge Medication List as of 01/01/2017  6:00 PM     I personally performed the services described in this documentation, which was scribed in my presence. The recorded information has been reviewed and is accurate.     Melene Plan, DO 01/01/17 1923

## 2017-05-06 ENCOUNTER — Ambulatory Visit: Payer: BC Managed Care – PPO | Admitting: Family Medicine

## 2017-05-29 ENCOUNTER — Ambulatory Visit (INDEPENDENT_AMBULATORY_CARE_PROVIDER_SITE_OTHER): Payer: 59 | Admitting: Emergency Medicine

## 2017-05-29 ENCOUNTER — Encounter: Payer: Self-pay | Admitting: Emergency Medicine

## 2017-05-29 VITALS — BP 118/76 | HR 69 | Temp 98.3°F | Resp 20 | Ht 67.6 in | Wt 154.2 lb

## 2017-05-29 DIAGNOSIS — R519 Headache, unspecified: Secondary | ICD-10-CM | POA: Insufficient documentation

## 2017-05-29 DIAGNOSIS — G44049 Chronic paroxysmal hemicrania, not intractable: Secondary | ICD-10-CM | POA: Insufficient documentation

## 2017-05-29 DIAGNOSIS — R51 Headache: Secondary | ICD-10-CM

## 2017-05-29 DIAGNOSIS — R42 Dizziness and giddiness: Secondary | ICD-10-CM

## 2017-05-29 MED ORDER — BUTALBITAL-APAP-CAFFEINE 50-325-40 MG PO TABS: 1.0000 | ORAL_TABLET | Freq: Four times a day (QID) | ORAL | 0 refills | Status: DC | PRN

## 2017-05-29 NOTE — Progress Notes (Signed)
Lindsay Robles 26 y.o.   Chief Complaint  Patient presents with  . Headache    X 2 mths with dizziness    HISTORY OF PRESENT ILLNESS: This is a 26 y.o. female complaining of h/o chronic headaches with dizziness the past 2 months.  Headache   This is a recurrent problem. The current episode started more than 1 month ago. The problem occurs intermittently. The problem has been waxing and waning. The pain is located in the right unilateral and retro-orbital region. The pain does not radiate. The pain quality is similar to prior headaches. The quality of the pain is described as pulsating and throbbing. The pain is at a severity of 6/10. The pain is moderate. Associated symptoms include dizziness, eye pain, insomnia and nausea. Pertinent negatives include no abdominal pain, back pain, blurred vision, coughing, ear pain, eye redness, eye watering, fever, loss of balance, muscle aches, neck pain, numbness, phonophobia, rhinorrhea, scalp tenderness, seizures, sinus pressure, sore throat, tingling, visual change, vomiting or weakness. She has tried NSAIDs for the symptoms. Her past medical history is significant for migraine headaches and migraines in the family (father). There is no history of cancer, cluster headaches, hypertension, recent head traumas, sinus disease or TMJ.     Prior to Admission medications   Medication Sig Start Date End Date Taking? Authorizing Provider  Capsaicin-Menthol (NEUVAXIN) 0.0375-5 % PTCH Apply topically.   Yes [provider]  etonogestrel-ethinyl estradiol (NUVARING) 0.12-0.015 MG/24HR vaginal ring Place 1 each vaginally every 28 (twenty-eight) days. Insert vaginally and leave in place for 3 consecutive weeks, then remove for 1 week.   Yes [provider]  HYDROcodone-acetaminophen (NORCO/VICODIN) 5-325 MG tablet Take 1-2 tablets by mouth every 4 (four) hours as needed for severe pain (per patient, dose not tolerated percoecet, does tolerate  vicodin). Patient not taking: Reported on 05/29/2017 07/06/15   Levi Aland, MD  ipratropium (ATROVENT) 0.06 % nasal spray Place 2 sprays into both nostrils 4 (four) times daily. Patient not taking: Reported on 05/29/2017 06/11/16   Linna Hoff, MD  Prenatal Vit-Fe Fumarate-FA (PRENATAL MULTIVITAMIN) TABS tablet Take 2 tablets by mouth daily at 12 noon.     [provider]    Allergies  Allergen Reactions  . Percocet [Oxycodone-Acetaminophen] Nausea And Vomiting    Patient Active Problem List   Diagnosis Date Noted  . Normal labor 07/04/2015  . Light smoker 03/03/2014    Past Medical History:  Diagnosis Date  . Depression   . Headache   . Infection    UTI- frequent    Past Surgical History:  Procedure Laterality Date  . NO PAST SURGERIES      Social History   Social History  . Marital status: Single    Spouse name: N/A  . Number of children: N/A  . Years of education: N/A   Occupational History  . Not on file.   Social History Main Topics  . Smoking status: Former Smoker    Packs/day: 0.25    Types: Cigarettes  . Smokeless tobacco: Never Used     Comment: quit with + pregnant  . Alcohol use 0.0 oz/week     Comment: none with preg  . Drug use: No  . Sexual activity: Yes    Partners: Male     Comment: NUVARING, intercourse age 68, sexual partners more than 5   Other Topics Concern  . Not on file   Social History Narrative  . No narrative on file  Family History  Problem Relation Age of Onset  . Hypertension Father   . Breast cancer Paternal Grandmother   . Cancer Paternal Grandmother      Review of Systems  Constitutional: Negative for fever.  HENT: Negative for ear pain, rhinorrhea, sinus pressure and sore throat.   Eyes: Positive for pain. Negative for blurred vision and redness.  Respiratory: Negative.  Negative for cough and shortness of breath.   Cardiovascular: Negative.  Negative for chest pain and palpitations.    Gastrointestinal: Positive for nausea. Negative for abdominal pain and vomiting.  Musculoskeletal: Negative.  Negative for back pain, joint pain and neck pain.  Neurological: Positive for dizziness and headaches. Negative for tingling, speech change, focal weakness, seizures, weakness, numbness and loss of balance.  Endo/Heme/Allergies: Negative.   Psychiatric/Behavioral: The patient has insomnia.   All other systems reviewed and are negative.    Physical Exam  Constitutional: She is oriented to person, place, and time. She appears well-developed and well-nourished.  HENT:  Head: Normocephalic and atraumatic.  Right Ear: External ear normal.  Left Ear: External ear normal.  Nose: Nose normal.  Mouth/Throat: Oropharynx is clear and moist.  Eyes: Pupils are equal, round, and reactive to light. Conjunctivae and EOM are normal.  Fundoscopic exam:      The right eye shows no hemorrhage and no papilledema.       The left eye shows no hemorrhage and no papilledema.  Neck: Normal range of motion. Neck supple. No JVD present. Carotid bruit is not present. No thyromegaly present.  Cardiovascular: Normal rate, regular rhythm, normal heart sounds and intact distal pulses.   Pulmonary/Chest: Effort normal and breath sounds normal.  Abdominal: Soft. Bowel sounds are normal. She exhibits no distension. There is no tenderness.  Musculoskeletal: Normal range of motion.  Lymphadenopathy:    She has no cervical adenopathy.  Neurological: She is alert and oriented to person, place, and time. She displays normal reflexes. No cranial nerve deficit or sensory deficit. She exhibits normal muscle tone. Coordination normal.  Skin: Skin is warm and dry. Capillary refill takes less than 2 seconds. No rash noted.  Psychiatric: She has a normal mood and affect. Her behavior is normal.  Vitals reviewed.    ASSESSMENT & PLAN: Lindsay Robles was seen today for headache.  Diagnoses and all orders for this  visit:  Nonintractable headache, unspecified chronicity pattern, unspecified headache type -     CBC with Differential/Platelet -     Comprehensive metabolic panel -     Sedimentation Rate -     TSH -     Ambulatory referral to Neurology -     MR Brain W Wo Contrast; Future  Dizziness -     CBC with Differential/Platelet -     Comprehensive metabolic panel -     Sedimentation Rate -     TSH -     Ambulatory referral to Neurology -     MR Brain W Wo Contrast; Future  Chronic paroxysmal hemicrania, not intractable -     MR Brain W Wo Contrast; Future  Other orders -     butalbital-acetaminophen-caffeine (FIORICET, ESGIC) 50-325-40 MG tablet; Take 1 tablet by mouth every 6 (six) hours as needed for headache.    Patient Instructions       IF you received an x-ray today, you will receive an invoice from [facility]. Please contact [facility] at [phone] with questions or concerns regarding your invoice.   IF you received labwork  today, you will receive an invoice from [facility]. Please contact [facility] at [phone] with questions or concerns regarding your invoice.   Our billing staff will not be able to assist you with questions regarding bills from these companies.  You will be contacted with the lab results as soon as they are available. The fastest way to get your results is to activate your My Chart account. Instructions are located on the last page of this paperwork. If you have not heard from [place] regarding the results in 2 weeks, please contact this office.    Dizziness Dizziness is a common problem. It makes you feel unsteady or light-headed. You may feel like you are about to pass out (faint). Dizziness can lead to getting hurt if you stumble or fall. Dizziness can be caused by many things, including:  Medicines.  Not having enough water in your body (dehydration).  Illness.  Follow these instructions at home: Eating and drinking  Drink  enough fluid to keep your pee (urine) clear or pale yellow. This helps to keep you from getting dehydrated. Try to drink more clear fluids, such as water.  Do not drink alcohol.  Limit how much caffeine you drink or eat, if your doctor tells you to do that.  Limit how much salt (sodium) you drink or eat, if your doctor tells you to do that. Activity  Avoid making quick movements. ? When you stand up from sitting in a chair, steady yourself until you feel okay. ? In the morning, first sit up on the side of the bed. When you feel okay, stand slowly while you hold onto something. Do this until you know that your balance is fine.  If you need to stand in one place for a long time, move your legs often. Tighten and relax the muscles in your legs while you are standing.  Do not drive or use heavy machinery if you feel dizzy.  Avoid bending down if you feel dizzy. Place items in your home so you can reach them easily without leaning over. Lifestyle  Do not use any products that contain nicotine or tobacco, such as cigarettes and e-cigarettes. If you need help quitting, ask your doctor.  Try to lower your stress level. You can do this by using methods such as yoga or meditation. Talk with your doctor if you need help. General instructions  Watch your dizziness for any changes.  Take over-the-counter and prescription medicines only as told by your doctor. Talk with your doctor if you think that you are dizzy because of a medicine that you are taking.  Tell a friend or a family member that you are feeling dizzy. If he or she notices any changes in your behavior, have this person call your doctor.  Keep all follow-up visits as told by your doctor. This is important. Contact a doctor if:  Your dizziness does not go away.  Your dizziness or light-headedness gets worse.  You feel sick to your stomach (nauseous).  You have trouble hearing.  You have new symptoms.  You are unsteady on  your feet.  You feel like the room is spinning. Get help right away if:  You throw up (vomit) or have watery poop (diarrhea), and you cannot eat or drink anything.  You have trouble: ? Talking. ? Walking. ? Swallowing. ? Using your arms, hands, or legs.  You feel generally weak.  You are not thinking clearly, or you have trouble forming sentences. A friend or family member  may notice this.  You have: ? Chest pain. ? Pain in your belly (abdomen). ? Shortness of breath. ? Sweating.  Your vision changes.  You are bleeding.  You have a very bad headache.  You have neck pain or a stiff neck.  You have a fever. These symptoms may be an emergency. Do not wait to see if the symptoms will go away. Get medical help right away. Call your local emergency services (911 in the [place] Do not drive yourself to the hospital. Summary  Dizziness makes you feel unsteady or light-headed. You may feel like you are about to pass out (faint).  Drink enough fluid to keep your pee (urine) clear or pale yellow. Do not drink alcohol.  Avoid making quick movements if you feel dizzy.  Watch your dizziness for any changes. This information is not intended to replace advice given to you by your health care provider. Make sure you discuss any questions you have with your health care provider. Document Released: 09/12/2011 Document Revised: 10/10/2016 Document Reviewed: 10/10/2016 Elsevier Interactive Patient Education  2017 [employer]  Migraine Headache A migraine headache is a very strong throbbing pain on one side or both sides of your head. Migraines can also cause other symptoms. Talk with your doctor about what things may bring on (trigger) your migraine headaches. Follow these instructions at home: Medicines  Take over-the-counter and prescription medicines only as told by your doctor.  Do not drive or use heavy machinery while taking prescription pain medicine.  To prevent or treat  constipation while you are taking prescription pain medicine, your doctor may recommend that you: ? Drink enough fluid to keep your pee (urine) clear or pale yellow. ? Take over-the-counter or prescription medicines. ? Eat foods that are high in fiber. These include fresh fruits and vegetables, whole grains, and beans. ? Limit foods that are high in fat and processed sugars. These include fried and sweet foods. Lifestyle  Avoid alcohol.  Do not use any products that contain nicotine or tobacco, such as cigarettes and e-cigarettes. If you need help quitting, ask your doctor.  Get at least 8 hours of sleep every night.  Limit your stress. General instructions   Keep a journal to find out what may bring on your migraines. For example, write down: ? What you eat and drink. ? How much sleep you get. ? Any change in what you eat or drink. ? Any change in your medicines.  If you have a migraine: ? Avoid things that make your symptoms worse, such as bright lights. ? It may help to lie down in a dark, quiet room. ? Do not drive or use heavy machinery. ? Ask your doctor what activities are safe for you.  Keep all follow-up visits as told by your doctor. This is important. Contact a doctor if:  You get a migraine that is different or worse than your usual migraines. Get help right away if:  Your migraine gets very bad.  You have a fever.  You have a stiff neck.  You have trouble seeing.  Your muscles feel weak or like you cannot control them.  You start to lose your balance a lot.  You start to have trouble walking.  You pass out (faint). This information is not intended to replace advice given to you by your health care provider. Make sure you discuss any questions you have with your health care provider. Document Released: 07/02/2008 Document Revised: 04/12/2016 Document Reviewed: 03/11/2016 Elsevier Interactive  Patient Education  2017 [employer].      Edwina Barth, MD [facility]

## 2017-05-29 NOTE — Patient Instructions (Addendum)
   IF you received an x-ray today, you will receive an invoice from [facility]. Please contact [facility] at [phone] with questions or concerns regarding your invoice.   IF you received labwork today, you will receive an invoice from [facility]. Please contact [facility] at [phone] with questions or concerns regarding your invoice.   Our billing staff will not be able to assist you with questions regarding bills from these companies.  You will be contacted with the lab results as soon as they are available. The fastest way to get your results is to activate your My Chart account. Instructions are located on the last page of this paperwork. If you have not heard from us regarding the results in 2 weeks, please contact this office.    Dizziness Dizziness is a common problem. It makes you feel unsteady or light-headed. You may feel like you are about to pass out (faint). Dizziness can lead to getting hurt if you stumble or fall. Dizziness can be caused by many things, including:  Medicines.  Not having enough water in your body (dehydration).  Illness.  Follow these instructions at home: Eating and drinking  Drink enough fluid to keep your pee (urine) clear or pale yellow. This helps to keep you from getting dehydrated. Try to drink more clear fluids, such as water.  Do not drink alcohol.  Limit how much caffeine you drink or eat, if your doctor tells you to do that.  Limit how much salt (sodium) you drink or eat, if your doctor tells you to do that. Activity  Avoid making quick movements. ? When you stand up from sitting in a chair, steady yourself until you feel okay. ? In the morning, first sit up on the side of the bed. When you feel okay, stand slowly while you hold onto something. Do this until you know that your balance is fine.  If you need to stand in one place for a long time, move your legs often. Tighten and relax the muscles in your legs  while you are standing.  Do not drive or use heavy machinery if you feel dizzy.  Avoid bending down if you feel dizzy. Place items in your home so you can reach them easily without leaning over. Lifestyle  Do not use any products that contain nicotine or tobacco, such as cigarettes and e-cigarettes. If you need help quitting, ask your doctor.  Try to lower your stress level. You can do this by using methods such as yoga or meditation. Talk with your doctor if you need help. General instructions  Watch your dizziness for any changes.  Take over-the-counter and prescription medicines only as told by your doctor. Talk with your doctor if you think that you are dizzy because of a medicine that you are taking.  Tell a friend or a family member that you are feeling dizzy. If he or she notices any changes in your behavior, have this person call your doctor.  Keep all follow-up visits as told by your doctor. This is important. Contact a doctor if:  Your dizziness does not go away.  Your dizziness or light-headedness gets worse.  You feel sick to your stomach (nauseous).  You have trouble hearing.  You have new symptoms.  You are unsteady on your feet.  You feel like the room is spinning. Get help right away if:  You throw up (vomit) or have watery poop (diarrhea), and you cannot eat or drink anything.  You have trouble: ?   Talking. ? Walking. ? Swallowing. ? Using your arms, hands, or legs.  You feel generally weak.  You are not thinking clearly, or you have trouble forming sentences. A friend or family member may notice this.  You have: ? Chest pain. ? Pain in your belly (abdomen). ? Shortness of breath. ? Sweating.  Your vision changes.  You are bleeding.  You have a very bad headache.  You have neck pain or a stiff neck.  You have a fever. These symptoms may be an emergency. Do not wait to see if the symptoms will go away. Get medical help right away. Call  your local emergency services (911 in the [place] Do not drive yourself to the hospital. Summary  Dizziness makes you feel unsteady or light-headed. You may feel like you are about to pass out (faint).  Drink enough fluid to keep your pee (urine) clear or pale yellow. Do not drink alcohol.  Avoid making quick movements if you feel dizzy.  Watch your dizziness for any changes. This information is not intended to replace advice given to you by your health care provider. Make sure you discuss any questions you have with your health care provider. Document Released: 09/12/2011 Document Revised: 10/10/2016 Document Reviewed: 10/10/2016 Elsevier Interactive Patient Education  2017 Elsevier Inc.  Migraine Headache A migraine headache is a very strong throbbing pain on one side or both sides of your head. Migraines can also cause other symptoms. Talk with your doctor about what things may bring on (trigger) your migraine headaches. Follow these instructions at home: Medicines  Take over-the-counter and prescription medicines only as told by your doctor.  Do not drive or use heavy machinery while taking prescription pain medicine.  To prevent or treat constipation while you are taking prescription pain medicine, your doctor may recommend that you: ? Drink enough fluid to keep your pee (urine) clear or pale yellow. ? Take over-the-counter or prescription medicines. ? Eat foods that are high in fiber. These include fresh fruits and vegetables, whole grains, and beans. ? Limit foods that are high in fat and processed sugars. These include fried and sweet foods. Lifestyle  Avoid alcohol.  Do not use any products that contain nicotine or tobacco, such as cigarettes and e-cigarettes. If you need help quitting, ask your doctor.  Get at least 8 hours of sleep every night.  Limit your stress. General instructions   Keep a journal to find out what may bring on your migraines. For example, write  down: ? What you eat and drink. ? How much sleep you get. ? Any change in what you eat or drink. ? Any change in your medicines.  If you have a migraine: ? Avoid things that make your symptoms worse, such as bright lights. ? It may help to lie down in a dark, quiet room. ? Do not drive or use heavy machinery. ? Ask your doctor what activities are safe for you.  Keep all follow-up visits as told by your doctor. This is important. Contact a doctor if:  You get a migraine that is different or worse than your usual migraines. Get help right away if:  Your migraine gets very bad.  You have a fever.  You have a stiff neck.  You have trouble seeing.  Your muscles feel weak or like you cannot control them.  You start to lose your balance a lot.  You start to have trouble walking.  You pass out (faint). This information is not intended  to replace advice given to you by your health care provider. Make sure you discuss any questions you have with your health care provider. Document Released: 07/02/2008 Document Revised: 04/12/2016 Document Reviewed: 03/11/2016 Elsevier Interactive Patient Education  2017 [employer].

## 2017-05-30 LAB — CBC WITH DIFFERENTIAL/PLATELET
Basophils Absolute: 0 10*3/uL (ref 0.0–0.2)
Basos: 0 %
EOS (ABSOLUTE): 0.1 10*3/uL (ref 0.0–0.4)
Eos: 2 %
HEMATOCRIT: 39.9 % (ref 34.0–46.6)
Hemoglobin: 13.8 g/dL (ref 11.1–15.9)
IMMATURE GRANULOCYTES: 0 %
Immature Grans (Abs): 0 10*3/uL (ref 0.0–0.1)
Lymphocytes Absolute: 2.8 10*3/uL (ref 0.7–3.1)
Lymphs: 37 %
MCH: 30.3 pg (ref 26.6–33.0)
MCHC: 34.6 g/dL (ref 31.5–35.7)
MCV: 88 fL (ref 79–97)
MONOS ABS: 0.7 10*3/uL (ref 0.1–0.9)
Monocytes: 9 %
NEUTROS ABS: 4 10*3/uL (ref 1.4–7.0)
Neutrophils: 52 %
PLATELETS: 402 10*3/uL — AB (ref 150–379)
RBC: 4.56 x10E6/uL (ref 3.77–5.28)
RDW: 13.4 % (ref 12.3–15.4)
WBC: 7.6 10*3/uL (ref 3.4–10.8)

## 2017-05-30 LAB — COMPREHENSIVE METABOLIC PANEL
ALT: 9 IU/L (ref 0–32)
AST: 14 IU/L (ref 0–40)
Albumin/Globulin Ratio: 1.5 (ref 1.2–2.2)
Albumin: 4.6 g/dL (ref 3.5–5.5)
Alkaline Phosphatase: 49 IU/L (ref 39–117)
BUN/Creatinine Ratio: 16 (ref 9–23)
BUN: 11 mg/dL (ref 6–20)
Bilirubin Total: 0.4 mg/dL (ref 0.0–1.2)
CALCIUM: 9.8 mg/dL (ref 8.7–10.2)
CO2: 22 mmol/L (ref 20–29)
CREATININE: 0.67 mg/dL (ref 0.57–1.00)
Chloride: 104 mmol/L (ref 96–106)
GFR calc Af Amer: 138 mL/min/{1.73_m2} (ref >59)
GFR, EST NON AFRICAN AMERICAN: 120 mL/min/{1.73_m2} (ref >59)
Globulin, Total: 3.1 g/dL (ref 1.5–4.5)
Glucose: 90 mg/dL (ref 65–99)
Potassium: 4.5 mmol/L (ref 3.5–5.2)
Sodium: 139 mmol/L (ref 134–144)
Total Protein: 7.7 g/dL (ref 6.0–8.5)

## 2017-05-30 LAB — TSH: TSH: 1.31 u[IU]/mL (ref 0.450–4.500)

## 2017-05-30 LAB — SEDIMENTATION RATE: SED RATE: 3 mm/h (ref 0–32)

## 2017-06-04 ENCOUNTER — Telehealth: Payer: Self-pay | Admitting: Emergency Medicine

## 2017-06-04 DIAGNOSIS — G4700 Insomnia, unspecified: Secondary | ICD-10-CM

## 2017-06-04 NOTE — Telephone Encounter (Signed)
Pt is wanting to talk with Dr. Alvy BimlerSagardia about getting something else to help her sleep the meds that was given on 05/29/17 it does help with the head pain but is not helping with sleep   Best number [phone]

## 2017-06-05 MED ORDER — HYDROXYZINE PAMOATE 25 MG PO CAPS: 25.0000 mg | ORAL_CAPSULE | Freq: Every evening | ORAL | 1 refills | Status: DC | PRN

## 2017-06-05 NOTE — Telephone Encounter (Signed)
Will prescribe Vistaril 25 mg at bedtime. Thanks.

## 2017-06-05 NOTE — Telephone Encounter (Signed)
Per chart, patient did not request any sleep medications. Please review and advise. Thank you

## 2017-06-06 ENCOUNTER — Telehealth: Payer: Self-pay | Admitting: Emergency Medicine

## 2017-06-06 NOTE — Telephone Encounter (Signed)
PATIENT STATES SHE SAW DR. SAGARDIA LAST Thursday (05/29/17) FOR HEADACHES. SHE SAID YESTERDAY HER HEADACHE WAS VERY BAD WITH RINGING IN HER EARS. SHE TOOK 2 OF THE MEDICATION HE PRESCRIBED HER TO HAVE AND LATER 2 IBUPROFEN. IT WAS RELIEVED SOME BUT NOT COMPLETELY. DOES HE THINK SHE NEEDS TO SEE A NEUROLOGIST? FOR HER TO HAVE A MRI IT WILL COST ABOUT $800.00. BEST PHONE [phone] (CELL) PHARMACY CHOICE IS [facility] [address]. MBC

## 2017-06-07 NOTE — Telephone Encounter (Signed)
Should follow up with Neurologist.

## 2017-06-10 NOTE — Telephone Encounter (Signed)
Spoke to patient.  She will follow up with neurology.  I asked her if she needs a referral, and she said no. I advised her to CB if we could be of further assistance to her.

## 2017-07-07 ENCOUNTER — Other Ambulatory Visit: Payer: Self-pay

## 2017-07-07 ENCOUNTER — Telehealth: Payer: Self-pay | Admitting: Emergency Medicine

## 2017-07-07 MED ORDER — BUTALBITAL-APAP-CAFFEINE 50-325-40 MG PO TABS: 1.0000 | ORAL_TABLET | Freq: Four times a day (QID) | ORAL | 0 refills | Status: DC | PRN

## 2017-07-07 NOTE — Telephone Encounter (Signed)
Rx sent in

## 2017-07-07 NOTE — Telephone Encounter (Signed)
PATIENT WOULD LIKE DR. SAGARDIA TO KNOW THAT SHE NEEDS A REFILL ON HER BUTALBITAL-ACETAMINOPHEN-CAFFEINE 50-325 MG FOR HER HEADACHES. SHE WOULD ALSO LIKE TO KNOW IF SHE CAN GET REFILLS OR IF SHE HAS TO RETURN TO THE OFFICE EACH TIME FIRST? SHE SAID IT REALLY WORKS WELL. BEST PHONE [phone] (CELL) MBC

## 2017-08-07 ENCOUNTER — Telehealth: Payer: Self-pay | Admitting: *Deleted

## 2017-08-07 ENCOUNTER — Other Ambulatory Visit: Payer: Self-pay

## 2017-08-07 MED ORDER — BUTALBITAL-APAP-CAFFEINE 50-325-40 MG PO TABS: 1.0000 | ORAL_TABLET | Freq: Four times a day (QID) | ORAL | 0 refills | Status: DC | PRN

## 2017-08-07 NOTE — Telephone Encounter (Signed)
Called [facility] [place] to check if Rx for butalbital-acemtam-caffeine, the medication is there. I called patient left message in voice mail if she wanted medication to [facility] [place], call and they can transfer Rx.

## 2017-08-25 ENCOUNTER — Other Ambulatory Visit: Payer: Self-pay | Admitting: Emergency Medicine

## 2017-08-25 DIAGNOSIS — G4700 Insomnia, unspecified: Secondary | ICD-10-CM

## 2017-10-02 ENCOUNTER — Telehealth: Payer: Self-pay | Admitting: Emergency Medicine

## 2017-10-02 NOTE — Telephone Encounter (Signed)
Copied from CRM [id number]. Topic: Quick Communication - Rx Refill/Question >> Oct 02, 2017  3:01 PM Darletta MollLander, Lumin L wrote: Has the patient contacted their pharmacy? No.  No refills (Agent: If no, request that the patient contact the pharmacy for the refill.) Preferred Pharmacy (with phone number or street name): [id number] [place], [place] - [address] Agent: Please be advised that RX refills may take up to 3 business days. We ask that you follow-up with your pharmacy. Refill butalbital-acetaminophen-caffeine (FIORICET, ESGIC) 50-325-40 MG tablet

## 2017-10-02 NOTE — Telephone Encounter (Signed)
Pt requesting a refill on butalbial-acetaminophen-caffeine.

## 2017-10-03 ENCOUNTER — Other Ambulatory Visit: Payer: Self-pay | Admitting: Emergency Medicine

## 2017-10-03 MED ORDER — BUTALBITAL-APAP-CAFFEINE 50-325-40 MG PO TABS: 1.0000 | ORAL_TABLET | Freq: Four times a day (QID) | ORAL | 0 refills | Status: DC | PRN

## 2017-10-03 NOTE — Telephone Encounter (Signed)
We will refill medication once only. Also look into that Neuro referral please.Thanks.

## 2017-10-03 NOTE — Telephone Encounter (Signed)
Spoke with pt today and she informed me that she could not afford the MRI and they have not [facility]. Pt states is there anything she can take or should she come back to be re-evaluated.

## 2017-10-03 NOTE — Telephone Encounter (Signed)
Is it ok if I refill this Medication?

## 2017-10-03 NOTE — Telephone Encounter (Signed)
Sent. Thanks.   

## 2017-10-03 NOTE — Telephone Encounter (Signed)
Called pt and informed that Rx has been sent to pharmacy. Also informed pt that she will need to see Neurology for further refills.  She verbalized understanding.

## 2017-10-03 NOTE — Telephone Encounter (Signed)
I saw this patient last August and was referred to Neurology x chronic headaches; also brain MRI was requested but I don't see either one completed. No, is not OK to refill this medication.

## 2017-10-03 NOTE — Telephone Encounter (Signed)
OK, do you want me to send it or will you send it and I will look into the referral.

## 2017-10-20 ENCOUNTER — Other Ambulatory Visit: Payer: Self-pay | Admitting: Emergency Medicine

## 2017-10-21 NOTE — Telephone Encounter (Signed)
Controlled substance 

## 2018-06-23 ENCOUNTER — Other Ambulatory Visit: Payer: Self-pay

## 2018-06-23 ENCOUNTER — Ambulatory Visit: Payer: 59 | Admitting: Emergency Medicine

## 2018-06-23 ENCOUNTER — Ambulatory Visit (INDEPENDENT_AMBULATORY_CARE_PROVIDER_SITE_OTHER): Payer: BLUE CROSS/BLUE SHIELD

## 2018-06-23 ENCOUNTER — Encounter: Payer: Self-pay | Admitting: Emergency Medicine

## 2018-06-23 ENCOUNTER — Ambulatory Visit: Payer: BLUE CROSS/BLUE SHIELD | Admitting: Emergency Medicine

## 2018-06-23 VITALS — BP 124/78 | HR 70 | Temp 98.9°F | Resp 16 | Ht 67.52 in | Wt 157.0 lb

## 2018-06-23 DIAGNOSIS — R51 Headache: Secondary | ICD-10-CM

## 2018-06-23 DIAGNOSIS — L659 Nonscarring hair loss, unspecified: Secondary | ICD-10-CM

## 2018-06-23 DIAGNOSIS — R0989 Other specified symptoms and signs involving the circulatory and respiratory systems: Secondary | ICD-10-CM

## 2018-06-23 DIAGNOSIS — Z77120 Contact with and (suspected) exposure to mold (toxic): Secondary | ICD-10-CM | POA: Diagnosis not present

## 2018-06-23 DIAGNOSIS — L509 Urticaria, unspecified: Secondary | ICD-10-CM

## 2018-06-23 DIAGNOSIS — R519 Headache, unspecified: Secondary | ICD-10-CM

## 2018-06-23 DIAGNOSIS — R635 Abnormal weight gain: Secondary | ICD-10-CM

## 2018-06-23 NOTE — Patient Instructions (Addendum)
     If you have lab work done today you will be contacted with your lab results within the next 2 weeks.  If you have not heard from us then please contact us. The fastest way to get your results is to register for My Chart.   IF you received an x-ray today, you will receive an invoice from [facility]. Please contact [facility] at [phone] with questions or concerns regarding your invoice.   IF you received labwork today, you will receive an invoice from [facility]. Please contact [facility] at [phone] with questions or concerns regarding your invoice.   Our billing staff will not be able to assist you with questions regarding bills from these companies.  You will be contacted with the lab results as soon as they are available. The fastest way to get your results is to activate your My Chart account. Instructions are located on the last page of this paperwork. If you have not heard from us regarding the results in 2 weeks, please contact this office.     Allergies An allergy is when your body reacts to a substance in a way that is not normal. An allergic reaction can happen after you:  Eat something.  Breathe in something.  Touch something.  You can be allergic to:  Things that are only around during certain seasons, like molds and pollens.  Foods.  Drugs.  Insects.  Animal dander.  What are the signs or symptoms?  Puffiness (swelling). This may happen on the lips, face, tongue, mouth, or throat.  Sneezing.  Coughing.  Breathing loudly (wheezing).  Stuffy nose.  Tingling in the mouth.  A rash.  Itching.  Itchy, red, puffy areas of skin (hives).  Watery eyes.  Throwing up (vomiting).  Watery poop (diarrhea).  Dizziness.  Feeling faint or fainting.  Trouble breathing or swallowing.  A tight feeling in the chest.  A fast heartbeat. How is this diagnosed? Allergies can be diagnosed with:  A medical and family  history.  Skin tests.  Blood tests.  A food diary. A food diary is a record of all the foods, drinks, and symptoms you have each day.  The results of an elimination diet. This diet involves making sure not to eat certain foods and then seeing what happens when you start eating them again.  How is this treated? There is no cure for allergies, but allergic reactions can be treated with medicine. Severe reactions usually need to be treated at a hospital. How is this prevented? The best way to prevent an allergic reaction is to avoid the thing you are allergic to. Allergy shots and medicines can also help prevent reactions in some cases. This information is not intended to replace advice given to you by your health care provider. Make sure you discuss any questions you have with your health care provider. Document Released: 01/18/2013 Document Revised: 05/20/2016 Document Reviewed: 07/05/2014 Elsevier Interactive Patient Education  2018 Elsevier Inc.  

## 2018-06-23 NOTE — Progress Notes (Signed)
Lindsay Robles 27 y.o.   Chief Complaint  Patient presents with  . Mold Exsposure    pt states she feels she has been exsposed to mold at her job, pt states she has been noticing hair loss, hives, and URI symptoms.   . Headache    HISTORY OF PRESENT ILLNESS: This is a 27 y.o. female concerned about possible mold exposure at her job for the past 4 months. Complaining of intermittent symptoms that get better once she is removed from the office.  Complaining of multiple symptoms that include headaches, weight gain, hair loss, intermittent rash looking like hives, URI symptoms.  Symptoms improve during her days off.  Symptoms are increasing her stress levels and making chronic headaches worse.  Exposure to mold bring her symptoms back.  Requesting mold exposure /allergy blood testing.  HPI   Prior to Admission medications   Medication Sig Start Date End Date Taking? Authorizing Provider  Capsaicin-Menthol (NEUVAXIN) 0.0375-5 % PTCH Apply topically.   Yes [provider]  etonogestrel-ethinyl estradiol (NUVARING) 0.12-0.015 MG/24HR vaginal ring Place 1 each vaginally every 28 (twenty-eight) days. Insert vaginally and leave in place for 3 consecutive weeks, then remove for 1 week.   Yes [provider]  hydrOXYzine (VISTARIL) 25 MG capsule TAKE 1 CAPSULE (25 MG TOTAL) BY MOUTH AT BEDTIME AS NEEDED. 08/25/17  Yes Kaori Jumper, Eilleen Kempf, MD  Prenatal Vit-Fe Fumarate-FA (PRENATAL MULTIVITAMIN) TABS tablet Take 2 tablets by mouth daily at 12 noon.    Yes [provider]    Allergies  Allergen Reactions  . Percocet [Oxycodone-Acetaminophen] Nausea And Vomiting    Patient Active Problem List   Diagnosis Date Noted  . 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 Depression   . Headache   . Infection    UTI- frequent    Past Surgical History:  Procedure Laterality Date  . NO PAST SURGERIES       Social History   Socioeconomic History  . Marital status: Single    Spouse name: Not on file  . Number of children: Not on file  . Years of education: Not on file  . Highest education level: Not on file  Occupational History  . Not on file  Social Needs  . Financial resource strain: Not on file  . Food insecurity:    Worry: Not on file    Inability: Not on file  . Transportation needs:    Medical: Not on file    Non-medical: Not on file  Tobacco Use  . Smoking status: Former Smoker    Packs/day: 0.25    Types: Cigarettes  . Smokeless tobacco: Never Used  . Tobacco comment: quit with + pregnant  Substance and Sexual Activity  . Alcohol use: Yes    Alcohol/week: 0.0 standard drinks    Comment: none with preg  . Drug use: No  . Sexual activity: Yes    Partners: Male    Comment: NUVARING, intercourse age 17, sexual partners more than 5  Lifestyle  . Physical activity:    Days per week: Not on file    Minutes per session: Not on file  . Stress: Not on file  Relationships  . Social connections:    Talks on phone: Not on file    Gets together: Not on file    Attends religious service: Not on file    Active member of club or organization: Not on file  Attends meetings of clubs or organizations: Not on file    Relationship status: Not on file  . Intimate partner violence:    Fear of current or ex partner: Not on file    Emotionally abused: Not on file    Physically abused: Not on file    Forced sexual activity: Not on file  Other Topics Concern  . Not on file  Social History Narrative  . Not on file    Family History  Problem Relation Age of Onset  . Hypertension Father   . Breast cancer Paternal Grandmother   . Cancer Paternal Grandmother      Review of Systems  Constitutional: Negative for chills and fever.       Weight gain Hair loss  HENT: Positive for congestion. Negative for nosebleeds and sore throat.   Eyes: Negative.  Negative for blurred  vision, double vision, discharge and redness.  Respiratory: Positive for cough. Negative for hemoptysis, shortness of breath and wheezing.   Cardiovascular: Negative.  Negative for chest pain, palpitations and leg swelling.  Gastrointestinal: Negative.  Negative for abdominal pain, heartburn, nausea and vomiting.  Genitourinary: Negative.  Negative for dysuria and hematuria.  Musculoskeletal: Negative for back pain, joint pain, myalgias and neck pain.  Skin: Positive for rash.  Neurological: Positive for headaches. Negative for sensory change, seizures and loss of consciousness.  Endo/Heme/Allergies: Negative.     Vitals:   06/23/18 1329  BP: 124/78  Pulse: 70  Resp: 16  Temp: 98.9 F (37.2 C)  SpO2: 95%     Physical Exam  Constitutional: She is oriented to person, place, and time. She appears well-developed and well-nourished.  HENT:  Head: Normocephalic and atraumatic.  Right Ear: External ear normal.  Left Ear: External ear normal.  Nose: Nose normal.  Mouth/Throat: Oropharynx is clear and moist.  Eyes: Pupils are equal, round, and reactive to light. Conjunctivae and EOM are normal.  Neck: Normal range of motion. Neck supple.  Cardiovascular: Normal rate, regular rhythm and normal heart sounds.  Pulmonary/Chest: Effort normal and breath sounds normal.  Abdominal: Soft. Bowel sounds are normal. She exhibits no distension. There is no tenderness.  Musculoskeletal: Normal range of motion. She exhibits no edema or tenderness.  Neurological: She is alert and oriented to person, place, and time.  Skin: Skin is warm and dry. Capillary refill takes less than 2 seconds. No rash noted.  Psychiatric: She has a normal mood and affect. Her behavior is normal.  Vitals reviewed.  Dg Chest 2 View  Result Date: 06/23/2018 CLINICAL DATA:  Respiratory complaints, mold exposure, former smoker. EXAM: CHEST - 2 VIEW COMPARISON:  None. FINDINGS: The lungs are well-expanded and clear. The heart  and pulmonary vascularity are normal. The mediastinum is normal in width. There is no pleural effusion. The trachea is midline. The bony thorax exhibits no acute abnormality. IMPRESSION: Mild hyperinflation may be voluntary or may reflect air trapping secondary to bronchospasm. No pneumonia, CHF, nor other acute cardiopulmonary abnormality. Electronically Signed   By: David  [place] M.D.   On: 06/23/2018 14:03     ASSESSMENT & PLAN: Evalynn was seen today for mold exsposure and headache.  Diagnoses and all orders for this visit:  Mold exposure -     CBC with Differential/Platelet -     Sedimentation Rate -     Allergen Profile, Mold -     Ambulatory referral to Allergy  Hair loss -     CBC with Differential/Platelet -  Comprehensive metabolic panel -     ANA,IFA RA Diag Pnl w/rflx Tit/Patn -     TSH  Weight gain -     CBC with Differential/Platelet -     Comprehensive metabolic panel -     TSH  Hives -     CBC with Differential/Platelet -     Comprehensive metabolic panel -     TSH  Respiratory symptoms -     DG Chest 2 View; Future  Chronic nonintractable headache, unspecified headache type    Patient Instructions       If you have lab work done today you will be contacted with your lab results within the next 2 weeks.  If you have not heard from us then please contact us. The fastest way to get your results is to register for My Chart.   IF you received an x-ray today, you will receive an invoice from [facility]. Please contact [facility] at [phone] with questions or concerns regarding your invoice.   IF you received labwork today, you will receive an invoice from [facility]. Please contact [facility] at [phone] with questions or concerns regarding your invoice.   Our billing staff will not be able to assist you with questions regarding bills from these companies.  You will be contacted with the lab results as soon as they are  available. The fastest way to get your results is to activate your My Chart account. Instructions are located on the last page of this paperwork. If you have not heard from us regarding the results in 2 weeks, please contact this office.      Allergies An allergy is when your body reacts to a substance in a way that is not normal. An allergic reaction can happen after you:  Eat something.  Breathe in something.  Touch something.  You can be allergic to:  Things that are only around during certain seasons, like molds and pollens.  Foods.  Drugs.  Insects.  Animal dander.  What are the signs or symptoms?  Puffiness (swelling). This may happen on the lips, face, tongue, mouth, or throat.  Sneezing.  Coughing.  Breathing loudly (wheezing).  Stuffy nose.  Tingling in the mouth.  A rash.  Itching.  Itchy, red, puffy areas of skin (hives).  Watery eyes.  Throwing up (vomiting).  Watery poop (diarrhea).  Dizziness.  Feeling faint or fainting.  Trouble breathing or swallowing.  A tight feeling in the chest.  A fast heartbeat. How is this diagnosed? Allergies can be diagnosed with:  A medical and family history.  Skin tests.  Blood tests.  A food diary. A food diary is a record of all the foods, drinks, and symptoms you have each day.  The results of an elimination diet. This diet involves making sure not to eat certain foods and then seeing what happens when you start eating them again.  How is this treated? There is no cure for allergies, but allergic reactions can be treated with medicine. Severe reactions usually need to be treated at a hospital. How is this prevented? The best way to prevent an allergic reaction is to avoid the thing you are allergic to. Allergy shots and medicines can also help prevent reactions in some cases. This information is not intended to replace advice given to you by your health care provider. Make sure you discuss  any questions you have with your health care provider. Document Released: 01/18/2013 Document Revised: 05/20/2016 Document Reviewed: 07/05/2014 Elsevier  Interactive Patient Education  2018 Elsevier Inc.     Edwina Barth, MD [facility]

## 2018-06-26 LAB — COMPREHENSIVE METABOLIC PANEL
ALT: 11 IU/L (ref 0–32)
AST: 16 IU/L (ref 0–40)
Albumin/Globulin Ratio: 1.5 (ref 1.2–2.2)
Albumin: 4.4 g/dL (ref 3.5–5.5)
Alkaline Phosphatase: 53 IU/L (ref 39–117)
BUN/Creatinine Ratio: 7 — ABNORMAL LOW (ref 9–23)
BUN: 5 mg/dL — ABNORMAL LOW (ref 6–20)
Bilirubin Total: 0.5 mg/dL (ref 0.0–1.2)
CALCIUM: 9.2 mg/dL (ref 8.7–10.2)
CHLORIDE: 105 mmol/L (ref 96–106)
CO2: 19 mmol/L — ABNORMAL LOW (ref 20–29)
CREATININE: 0.76 mg/dL (ref 0.57–1.00)
GFR calc Af Amer: 123 mL/min/{1.73_m2} (ref >59)
GFR calc non Af Amer: 106 mL/min/{1.73_m2} (ref >59)
GLOBULIN, TOTAL: 2.9 g/dL (ref 1.5–4.5)
Glucose: 99 mg/dL (ref 65–99)
Potassium: 4.2 mmol/L (ref 3.5–5.2)
SODIUM: 141 mmol/L (ref 134–144)
TOTAL PROTEIN: 7.3 g/dL (ref 6.0–8.5)

## 2018-06-26 LAB — CBC WITH DIFFERENTIAL/PLATELET
BASOS: 0 %
Basophils Absolute: 0 10*3/uL (ref 0.0–0.2)
EOS (ABSOLUTE): 0.1 10*3/uL (ref 0.0–0.4)
EOS: 1 %
HEMATOCRIT: 39.8 % (ref 34.0–46.6)
Hemoglobin: 13.3 g/dL (ref 11.1–15.9)
IMMATURE GRANS (ABS): 0 10*3/uL (ref 0.0–0.1)
IMMATURE GRANULOCYTES: 0 %
Lymphocytes Absolute: 2.5 10*3/uL (ref 0.7–3.1)
Lymphs: 33 %
MCH: 30.2 pg (ref 26.6–33.0)
MCHC: 33.4 g/dL (ref 31.5–35.7)
MCV: 90 fL (ref 79–97)
MONOS ABS: 0.5 10*3/uL (ref 0.1–0.9)
Monocytes: 7 %
Neutrophils Absolute: 4.4 10*3/uL (ref 1.4–7.0)
Neutrophils: 59 %
Platelets: 408 10*3/uL (ref 150–450)
RBC: 4.41 x10E6/uL (ref 3.77–5.28)
RDW: 12.8 % (ref 12.3–15.4)
WBC: 7.4 10*3/uL (ref 3.4–10.8)

## 2018-06-26 LAB — SEDIMENTATION RATE: SED RATE: 24 mm/h (ref 0–32)

## 2018-06-26 LAB — ANA,IFA RA DIAG PNL W/RFLX TIT/PATN
ANA TITER 1: NEGATIVE
Cyclic Citrullin Peptide Ab: 5 units (ref 0–19)

## 2018-06-26 LAB — ALLERGEN PROFILE, MOLD
Aureobasidi Pullulans IgE: <0.1 kU/L
Candida Albicans IgE: <0.1 kU/L
M009-IgE Fusarium proliferatum: <0.1 kU/L
Mucor Racemosus IgE: <0.1 kU/L
Setomelanomma Rostrat: <0.1 kU/L

## 2018-06-26 LAB — TSH: TSH: 1.15 u[IU]/mL (ref 0.450–4.500)

## 2019-06-25 ENCOUNTER — Emergency Department (HOSPITAL_COMMUNITY)
Admission: EM | Admit: 2019-06-25 | Discharge: 2019-06-25 | Disposition: A | Discharge status: Home / Self Care | Payer: Self-pay | Attending: Emergency Medicine | Admitting: Emergency Medicine

## 2019-06-25 ENCOUNTER — Other Ambulatory Visit: Payer: Self-pay

## 2019-06-25 ENCOUNTER — Encounter (HOSPITAL_COMMUNITY): Payer: Self-pay | Admitting: Emergency Medicine

## 2019-06-25 ENCOUNTER — Emergency Department (HOSPITAL_COMMUNITY): Payer: Self-pay

## 2019-06-25 DIAGNOSIS — Z87891 Personal history of nicotine dependence: Secondary | ICD-10-CM | POA: Insufficient documentation

## 2019-06-25 DIAGNOSIS — Z793 Long term (current) use of hormonal contraceptives: Secondary | ICD-10-CM | POA: Insufficient documentation

## 2019-06-25 DIAGNOSIS — Z885 Allergy status to narcotic agent status: Secondary | ICD-10-CM | POA: Insufficient documentation

## 2019-06-25 DIAGNOSIS — R52 Pain, unspecified: Secondary | ICD-10-CM

## 2019-06-25 DIAGNOSIS — N938 Other specified abnormal uterine and vaginal bleeding: Secondary | ICD-10-CM | POA: Insufficient documentation

## 2019-06-25 HISTORY — DX: Anogenital (venereal) warts: A63.0

## 2019-06-25 HISTORY — DX: Bipolar disorder, unspecified: F31.9

## 2019-06-25 LAB — URINALYSIS, ROUTINE W REFLEX MICROSCOPIC
Bacteria, UA: NONE SEEN
Bilirubin Urine: NEGATIVE
Glucose, UA: NEGATIVE mg/dL
Ketones, ur: NEGATIVE mg/dL
Leukocytes,Ua: NEGATIVE
Nitrite: NEGATIVE
Protein, ur: NEGATIVE mg/dL
RBC / HPF: >50 RBC/hpf — ABNORMAL HIGH (ref 0–5)
Specific Gravity, Urine: 1.02 (ref 1.005–1.030)
pH: 5 (ref 5.0–8.0)

## 2019-06-25 LAB — WET PREP, GENITAL
Clue Cells Wet Prep HPF POC: NONE SEEN
Sperm: NONE SEEN
Trich, Wet Prep: NONE SEEN
Yeast Wet Prep HPF POC: NONE SEEN

## 2019-06-25 LAB — POC URINE PREG, ED: Preg Test, Ur: NEGATIVE

## 2019-06-25 MED ORDER — IBUPROFEN 600 MG PO TABS: 600.0000 mg | ORAL_TABLET | Freq: Four times a day (QID) | ORAL | 0 refills | Status: DC | PRN

## 2019-06-25 NOTE — ED Notes (Signed)
Patient still in [place].

## 2019-06-25 NOTE — ED Notes (Signed)
Patient states she took Advil for her headache.

## 2019-06-25 NOTE — ED Triage Notes (Signed)
Patient reports vaginal bleeding x 12 days. Is on OCP to help control periods. patient states symptoms are new.

## 2019-06-25 NOTE — ED Provider Notes (Signed)
[facility] EMERGENCY DEPARTMENT Provider Note   CSN: [id number] Arrival date & time: 06/25/19  1226     History   Chief Complaint Chief Complaint  Patient presents with  . Vaginal Bleeding    HPI Lindsay Robles is a 28 y.o. female with a history of bipolar disorder, HPV and depression presenting with a 2 week history of a prolonged menses with heavier than normal bleeding and intermittent severe menstrual cramping.  She has used NuvaRing for over a year for birth control with no prior problems with this form of birth control and her withdrawal cycles have been consistent. She denies vaginal discharge, fevers, chills, dysuria or other complaints. She was being seen by a gynecologist in [place], but recently lost health insurance for f/u care.     The history is provided by the patient.    Past Medical History:  Diagnosis Date  . Bipolar 1 disorder ([place])   . Depression   . Headache   . HPV (human papilloma virus) anogenital infection   . Infection    UTI- frequent    Patient Active Problem List   Diagnosis Date Noted  . Chronic paroxysmal hemicrania, not intractable 05/29/2017    Past Surgical History:  Procedure Laterality Date  . NO PAST SURGERIES       OB History    Gravida  1   Para  1   Term  1   Preterm      AB      Living  1     SAB      TAB      Ectopic      Multiple  0   Live Births  1            Home Medications    Prior to Admission medications   Medication Sig Start Date End Date Taking? Authorizing Provider  Capsaicin-Menthol (NEUVAXIN) 0.0375-5 % PTCH Apply topically.    [provider]  etonogestrel-ethinyl estradiol (NUVARING) 0.12-0.015 MG/24HR vaginal ring Place 1 each vaginally every 28 (twenty-eight) days. Insert vaginally and leave in place for 3 consecutive weeks, then remove for 1 week.    [provider]  hydrOXYzine (VISTARIL) 25 MG capsule TAKE 1 CAPSULE (25 MG TOTAL) BY MOUTH AT BEDTIME AS  NEEDED. 08/25/17   Horald Pollen, MD  ibuprofen (ADVIL) 600 MG tablet Take 1 tablet (600 mg total) by mouth every 6 (six) hours as needed. 06/25/19   Evalee Jefferson, PA-C  OXcarbazepine (TRILEPTAL) 150 MG tablet Take 150 mg by mouth 2 (two) times daily.    [provider]  Prenatal Vit-Fe Fumarate-FA (PRENATAL MULTIVITAMIN) TABS tablet Take 2 tablets by mouth daily at 12 noon.     [provider]    Family History Family History  Problem Relation Age of Onset  . Hypertension Father   . Breast cancer Paternal Grandmother   . Cancer Paternal Grandmother     Social History Social History   Tobacco Use  . Smoking status: Former Smoker    Packs/day: 0.25    Types: Cigarettes  . Smokeless tobacco: Never Used  . Tobacco comment: quit with + pregnant  Substance Use Topics  . Alcohol use: Yes    Alcohol/week: 0.0 standard drinks    Comment: none with preg  . Drug use: No     Allergies   Percocet [oxycodone-acetaminophen]   Review of Systems Review of Systems  Constitutional: Negative for chills and fever.  HENT: Negative for congestion  and sore throat.   Eyes: Negative.   Respiratory: Negative for chest tightness and shortness of breath.   Cardiovascular: Negative for chest pain.  Gastrointestinal: Negative for abdominal pain and nausea.  Genitourinary: Positive for menstrual problem, pelvic pain and vaginal bleeding. Negative for dysuria, vaginal discharge and vaginal pain.  Musculoskeletal: Negative for arthralgias, joint swelling and neck pain.  Skin: Negative.  Negative for rash and wound.  Neurological: Negative for dizziness, weakness, light-headedness, numbness and headaches.  Psychiatric/Behavioral: Negative.      Physical Exam Updated Vital Signs BP 123/74 (BP Location: Right Arm)   Pulse 71   Temp 98.2 F (36.8 C) (Oral)   Resp 16   LMP 06/14/2019   SpO2 100%   Physical Exam Vitals signs and nursing note reviewed. Exam conducted  with a chaperone present.  Constitutional:      Appearance: She is well-developed.  HENT:     Head: Normocephalic and atraumatic.  Eyes:     Conjunctiva/sclera: Conjunctivae normal.     Comments: No conjunctival pallor  Neck:     Musculoskeletal: Normal range of motion.  Cardiovascular:     Rate and Rhythm: Normal rate and regular rhythm.     Heart sounds: Normal heart sounds.  Pulmonary:     Effort: Pulmonary effort is normal.     Breath sounds: Normal breath sounds. No wheezing.  Abdominal:     General: Bowel sounds are normal.     Palpations: Abdomen is soft.     Tenderness: There is no abdominal tenderness.  Genitourinary:    Vagina: Normal.     Cervix: Cervical bleeding present. No cervical motion tenderness, discharge or erythema.     Uterus: Normal. Not enlarged and not tender.      Adnexa: Left adnexa normal.       Right: Tenderness present. No fullness.    Musculoskeletal: Normal range of motion.  Skin:    General: Skin is warm and dry.     Capillary Refill: Capillary refill takes less than 2 seconds.     Coloration: Skin is not pale.  Neurological:     Mental Status: She is alert.      ED Treatments / Results  Labs (all labs ordered are listed, but only abnormal results are displayed) Labs Reviewed  WET PREP, GENITAL - Abnormal; Notable for the following components:      Result Value   WBC, Wet Prep HPF POC MODERATE (*)    All other components within normal limits  URINALYSIS, ROUTINE W REFLEX MICROSCOPIC - Abnormal; Notable for the following components:   Hgb urine dipstick LARGE (*)    RBC / HPF >50 (*)    All other components within normal limits  POC URINE PREG, ED  GC/CHLAMYDIA PROBE AMP ([place]) NOT AT [facility]    EKG None  Radiology US Pelvic Complete W Transvaginal And Torsion R/o  Result Date: 06/25/2019 CLINICAL DATA:  Bleeding for 12 days EXAM: TRANSABDOMINAL AND TRANSVAGINAL ULTRASOUND OF PELVIS TECHNIQUE: Both transabdominal and  transvaginal ultrasound examinations of the pelvis were performed. Transabdominal technique was performed for global imaging of the pelvis including uterus, ovaries, adnexal regions, and pelvic cul-de-sac. It was necessary to proceed with endovaginal exam following the transabdominal exam to visualize the . COMPARISON:  None FINDINGS: Uterus Measurements: 6.7 x 2.5 x 4.5 = volume: 39 mL. The uterus is retroverted. No fibroids or other mass visualized. Endometrium Thickness: 3 mm.  No focal abnormality visualized. Right ovary Measurements: 2.7 x 2.0  x 1.9 = volume: 2.7 mL. Normal appearance/no adnexal mass. Left ovary Measurements: 3.0 x 2.1 x 2.3 = volume: 3.0 mL. Normal appearance/no adnexal mass. Other findings No abnormal free fluid. IMPRESSION: Normal appearing uterus and ovaries.  Endometrium measuring 3 mm. Electronically Signed   By: Jonna ClarkBindu  Avutu M.D.   On: 06/25/2019 17:16    Procedures Procedures (including critical care time)  Medications Ordered in ED Medications - No data to display   Initial Impression / Assessment and Plan / ED Course  I have reviewed the triage vital signs and the nursing notes.  Pertinent labs & imaging results that were available during my care of the patient were reviewed by me and considered in my medical decision making (see chart for details).        Pt with dysfunctional uterine bleeding, right ovarian/adnexal tenderness but normal US.  Labs also normal, hgb in UA, but this was a clean catch in a menstruating female.  No infection. GC/chlamydia results pending but low risk for std.  Pt without signs/sx of profound blood loss or anemia. She was encouraged to f/u with gyn for persistent sx.  [facility] given current insurance concerns.  Prn f/u anticipated.  Final Clinical Impressions(s) / ED Diagnoses   Final diagnoses:  Dysfunctional uterine bleeding    ED Discharge Orders         Ordered    ibuprofen (ADVIL) 600 MG tablet  Every 6  hours PRN     06/25/19 1748           Burgess Amordol, Addysin Porco, PA-C 06/26/19 1828    Samuel JesterMcManus, Kathleen, DO 06/27/19 1623

## 2019-06-25 NOTE — Discharge Instructions (Signed)
Your lab tests today are fine as is your ultrasound - there is no structural abnormalities on your ultrasound which would explain yor abnormal bleeding.  This is possibly a hormonal imbalance that may require further evaluation beyond the ability for an emergency department to complete.  Plan to follow as suggested if your symptoms persist.  You also consider placing a new Nuvaring which may help get your bleeding under better control.  Use the ibuprofen for cramping.

## 2019-06-25 NOTE — ED Notes (Signed)
Patient to US

## 2019-06-27 ENCOUNTER — Other Ambulatory Visit: Payer: Self-pay | Admitting: Emergency Medicine

## 2019-06-29 LAB — CERVICOVAGINAL ANCILLARY ONLY
Chlamydia: NEGATIVE
Neisseria Gonorrhea: NEGATIVE

## 2020-07-06 ENCOUNTER — Ambulatory Visit: Payer: Medicaid Other | Attending: Internal Medicine

## 2020-07-06 DIAGNOSIS — Z23 Encounter for immunization: Secondary | ICD-10-CM

## 2020-07-06 NOTE — Progress Notes (Signed)
   Covid-19 Vaccination Clinic  Name:  Lindsay Robles    MRN: [id number] DOB: December 13, 1990  07/06/2020  [name] was observed post Covid-19 immunization for 15 minutes without incident. She was provided with Vaccine Information Sheet and instruction to access the V-Safe system.   [name] was instructed to call 911 with any severe reactions post vaccine: Marland Kitchen Difficulty breathing  . Swelling of face and throat  . A fast heartbeat  . A bad rash all over body  . Dizziness and weakness   Immunizations Administered    Name Date Dose VIS Date Route   JANSSEN COVID-19 VACCINE 07/06/2020  4:03 PM 0.5 mL 12/04/2019 Intramuscular   Manufacturer: [employer]   Lot: [id number]   NDC: 07371-062-69

## 2020-10-18 ENCOUNTER — Ambulatory Visit
Admission: EM | Admit: 2020-10-18 | Discharge: 2020-10-18 | Disposition: A | Discharge status: Home / Self Care | Payer: 59 | Attending: Family Medicine | Admitting: Family Medicine

## 2020-10-18 ENCOUNTER — Encounter: Payer: Self-pay | Admitting: Emergency Medicine

## 2020-10-18 ENCOUNTER — Ambulatory Visit (INDEPENDENT_AMBULATORY_CARE_PROVIDER_SITE_OTHER): Payer: 59

## 2020-10-18 DIAGNOSIS — Z3202 Encounter for pregnancy test, result negative: Secondary | ICD-10-CM

## 2020-10-18 DIAGNOSIS — M545 Low back pain, unspecified: Secondary | ICD-10-CM

## 2020-10-18 LAB — POCT URINE PREGNANCY: Preg Test, Ur: NEGATIVE

## 2020-10-18 MED ORDER — PREDNISONE 10 MG (21) PO TBPK: ORAL_TABLET | Freq: Every day | ORAL | 0 refills | Status: AC

## 2020-10-18 NOTE — Discharge Instructions (Addendum)
Your x-ray is negative today.  I have sent in a prednisone taper for you to take for 6 days. 6 tablets on day one, 5 tablets on day two, 4 tablets on day three, 3 tablets on day four, 2 tablets on day five, and 1 tablet on day six.  I will continue with naproxen and ibuprofen as an anti-inflammatory.  If symptoms are continuing, I have attached information for [facility] care for you to follow-up with  Follow up with this office or with primary care if symptoms are persisting.  Follow up in the ER for high fever, trouble swallowing, trouble breathing, other concerning symptoms.

## 2020-10-18 NOTE — ED Triage Notes (Signed)
Low back pain started x 1 month ago after moving into new apartment.  Pain got worse yesterday and this morning after carrying some heavy books.

## 2020-10-18 NOTE — ED Provider Notes (Signed)
[facility]   [id number] 10/18/20 Arrival Time: 1109  ZY:SAYTK PAIN  SUBJECTIVE: History from: patient. Lindsay Robles is a 30 y.o. female complains of low back pain that started about a month ago.  Reports that the pain worsened yesterday after she was carrying heavy books.  Reports that the pain is in the middle of her lumbar spine.  Denies muscle pain on either side.  Reports that she is taken ibuprofen, Tylenol and has home muscle relaxers for neck pain.  Reports that these do not help.  Reports a sharp shooting pain when she bends, coughs, does anything with force.  Denies similar symptoms in the past.  Denies fever, chills, erythema, ecchymosis, effusion, weakness, numbness and tingling, saddle paresthesias, loss of bowel or bladder function.      ROS: As per HPI.  All other pertinent ROS negative.     Past Medical History:  Diagnosis Date  . Bipolar 1 disorder ([facility])   . Depression   . Headache   . HPV (human papilloma virus) anogenital infection   . Infection    UTI- frequent   Past Surgical History:  Procedure Laterality Date  . NO PAST SURGERIES     Allergies  Allergen Reactions  . Percocet [Oxycodone-Acetaminophen] Nausea And Vomiting   No current facility-administered medications on file prior to encounter.   Current Outpatient Medications on File Prior to Encounter  Medication Sig Dispense Refill  . Capsaicin-Menthol (NEUVAXIN) 0.0375-5 % PTCH Apply topically.    Marland Kitchen etonogestrel-ethinyl estradiol (NUVARING) 0.12-0.015 MG/24HR vaginal ring Place 1 each vaginally every 28 (twenty-eight) days. Insert vaginally and leave in place for 3 consecutive weeks, then remove for 1 week.    . hydrOXYzine (VISTARIL) 25 MG capsule TAKE 1 CAPSULE (25 MG TOTAL) BY MOUTH AT BEDTIME AS NEEDED. 30 capsule 1  . ibuprofen (ADVIL) 600 MG tablet Take 1 tablet (600 mg total) by mouth every 6 (six) hours as needed. 30 tablet 0  . OXcarbazepine (TRILEPTAL) 150 MG tablet Take 150 mg  by mouth 2 (two) times daily.    . Prenatal Vit-Fe Fumarate-FA (PRENATAL MULTIVITAMIN) TABS tablet Take 2 tablets by mouth daily at 12 noon.      Social History   Socioeconomic History  . Marital status: Single    Spouse name: Not on file  . Number of children: Not on file  . Years of education: Not on file  . Highest education level: Not on file  Occupational History  . Not on file  Tobacco Use  . Smoking status: Former Smoker    Packs/day: 0.25    Types: Cigarettes  . Smokeless tobacco: Never Used  . Tobacco comment: quit with + pregnant  Vaping Use  . Vaping Use: Never used  Substance and Sexual Activity  . Alcohol use: Yes    Alcohol/week: 0.0 standard drinks    Comment: occ  . Drug use: No  . Sexual activity: Yes    Partners: Male    Birth control/protection: Pill    Comment: NUVARING, intercourse age 26, sexual partners more than 5  Other Topics Concern  . Not on file  Social History Narrative  . Not on file   Social Determinants of Health   Financial Resource Strain: Not on file  Food Insecurity: Not on file  Transportation Needs: Not on file  Physical Activity: Not on file  Stress: Not on file  Social Connections: Not on file  Intimate Partner Violence: Not on file   Family History  Problem Relation Age of Onset  . Hypertension Father   . Breast cancer Paternal Grandmother   . Cancer Paternal Grandmother     OBJECTIVE:  Vitals:   10/18/20 1146 10/18/20 1148  BP:  126/81  Pulse:  71  Resp:  18  Temp:  98.9 F (37.2 C)  TempSrc:  Oral  SpO2:  99%  Weight: 155 lb (70.3 kg)   Height: 5\' 7"  (1.702 m)     General appearance: ALERT; in no acute distress.  Head: NCAT Lungs: Normal respiratory effort CV:  pulses 2+ bilaterally. Cap refill < 2 seconds Musculoskeletal:  Inspection: Skin warm, dry, clear and intact without obvious erythema, effusion, or ecchymosis.  Palpation: Mildly tender to palpation to lumbar spine ROM: limited ROM active and  passive with bending, twisting, changing positions Skin: warm and dry Neurologic: Ambulates without difficulty; Sensation intact about the upper/ lower extremities Psychological: alert and cooperative; normal mood and affect  DIAGNOSTIC STUDIES:  DG Lumbar Spine 2-3 Views  Result Date: 10/18/2020 CLINICAL DATA:  Low back pain, no known injury EXAM: LUMBAR SPINE - 2-3 VIEW COMPARISON:  None. FINDINGS: There is no evidence of lumbar spine fracture. Alignment is normal. Intervertebral disc spaces are maintained. Nonobstructive pattern of overlying bowel gas. IMPRESSION: No fracture or dislocation of the lumbar spine. Disc spaces and vertebral body heights are preserved. Lumbar disc and neural foraminal pathology may be further evaluated by MRI if indicated by neurologically localizing signs and symptoms. Electronically Signed   By: 12/16/2020 M.D.   On: 10/18/2020 12:30     ASSESSMENT & PLAN:  1. Lumbar back pain   2. Negative pregnancy test    Xray negative Follow up with ortho care if symptoms persist Continue conservative management of rest, ice, and gentle stretches Take ibuprofen as needed for pain relief (may cause abdominal discomfort, ulcers, and GI bleeds avoid taking with other NSAIDs) Follow up with PCP if symptoms persist Return or go to the ER if you have any new or worsening symptoms (fever, chills, chest pain, abdominal pain, changes in bowel or bladder habits, pain radiating into lower legs)   Reviewed expectations re: course of current medical issues. Questions answered. Outlined signs and symptoms indicating need for more acute intervention. Patient verbalized understanding. After Visit Summary given.       12/16/2020, NP 10/18/20 1245

## 2021-07-22 IMAGING — US US PELVIS COMPLETE TRANSABD/TRANSVAG W DUPLEX
1 series · 14 of 25 positions shown · non-contrast
Comparison: None

CLINICAL DATA: Bleeding for 12 days



[Series 1: us pelvis complete transabd/transvag w duplex · 0.21mm/px · 14 of 89 slices shown]
[im 1/89]
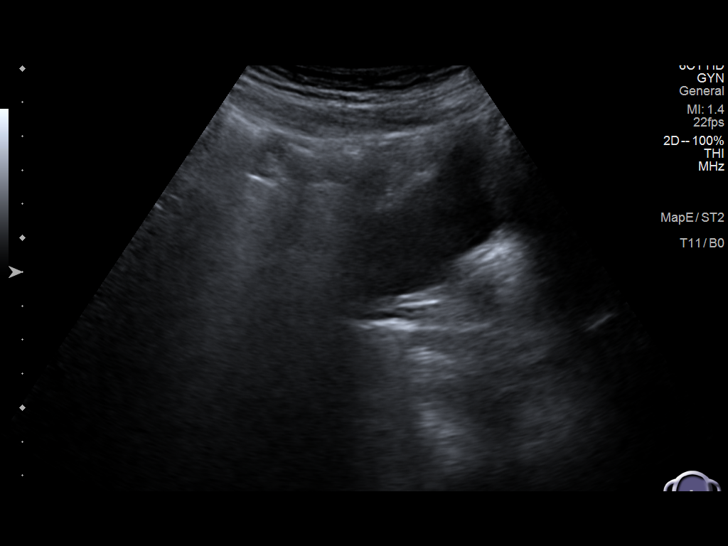
[im 8/89]
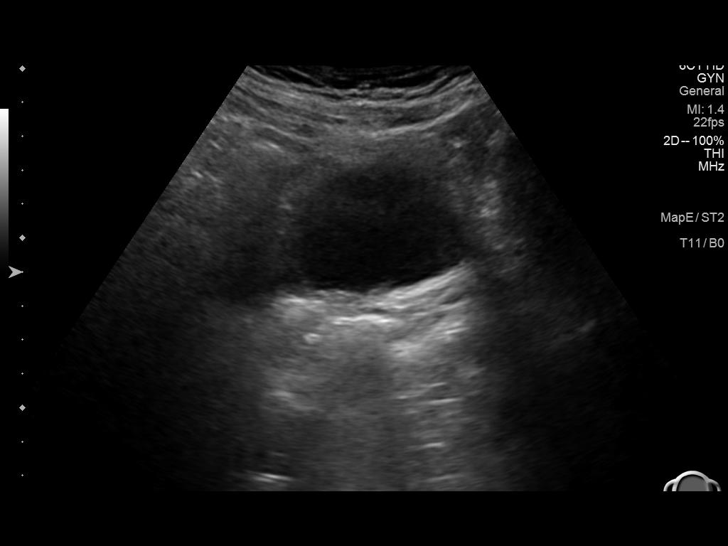
[im 15/89]
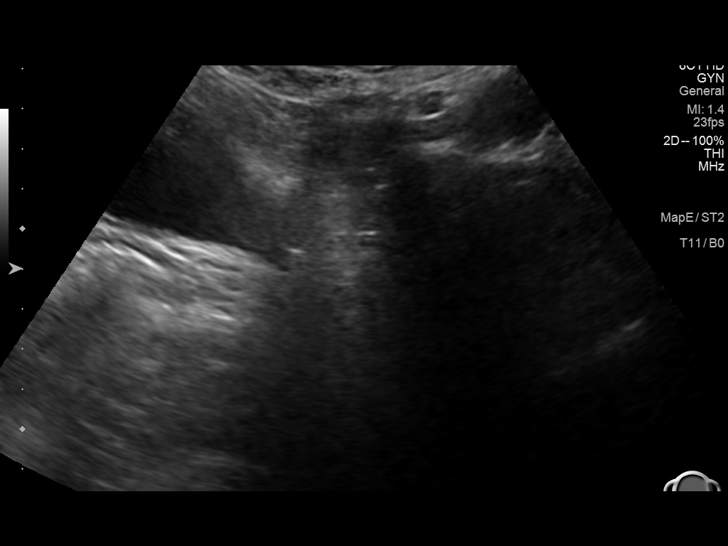
[im 23/89]
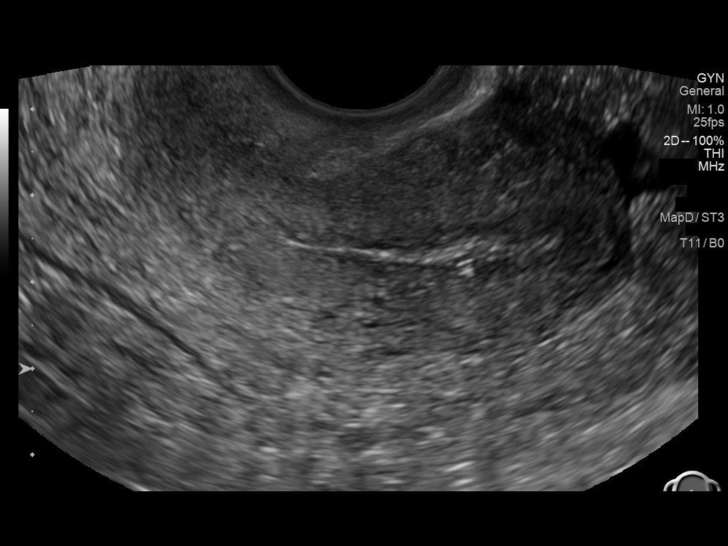
[im 30/89]
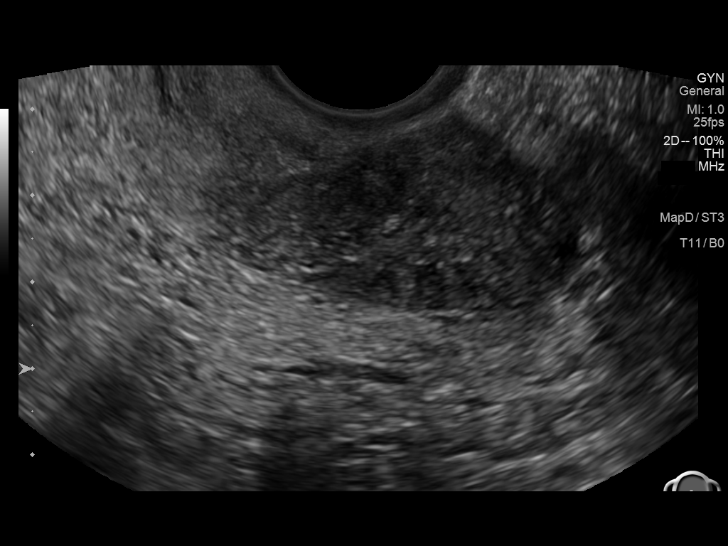
[im 34/89]
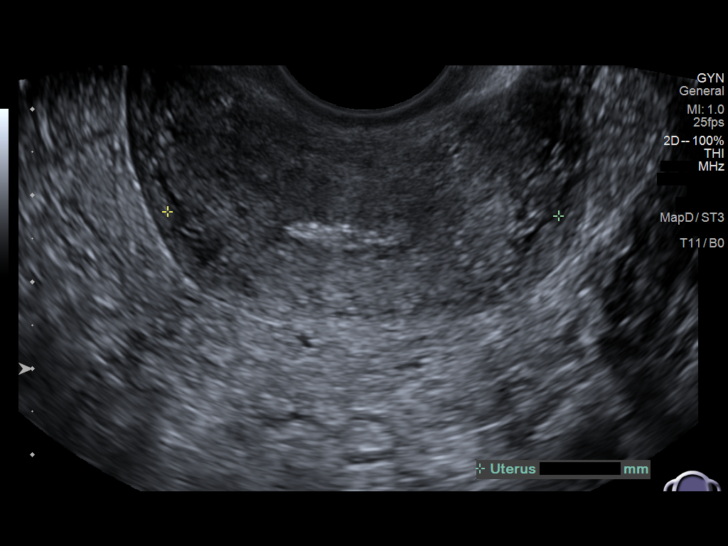
[im 41/89]
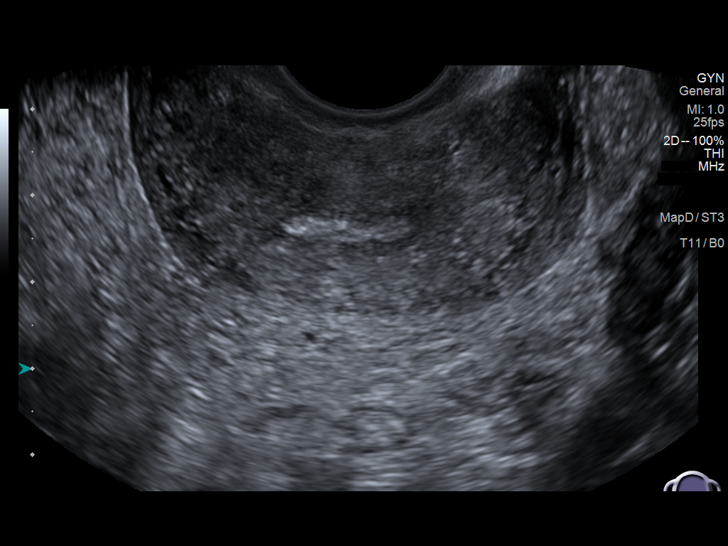
[im 48/89]
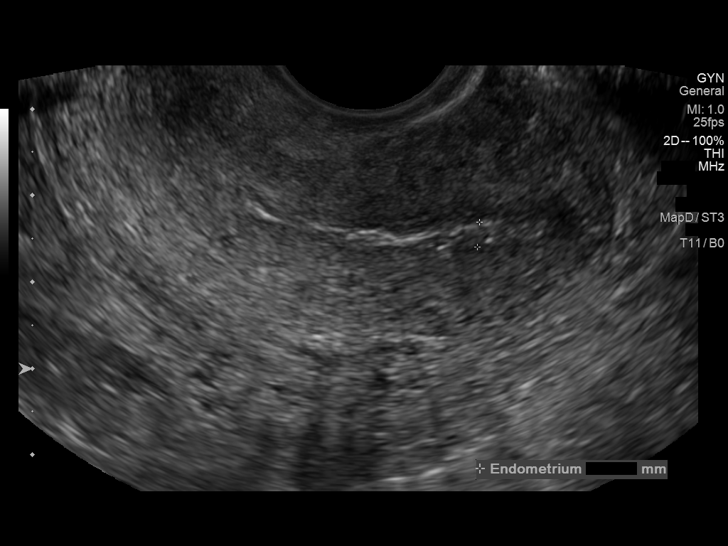
[im 56/89]
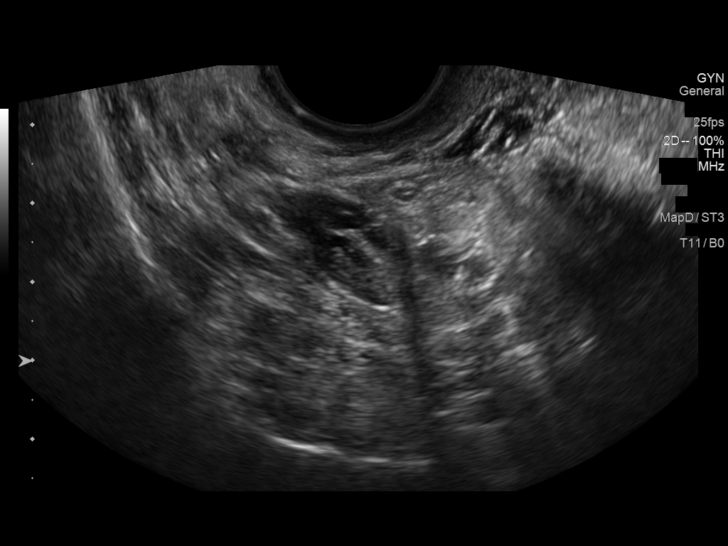
[im 59/89]
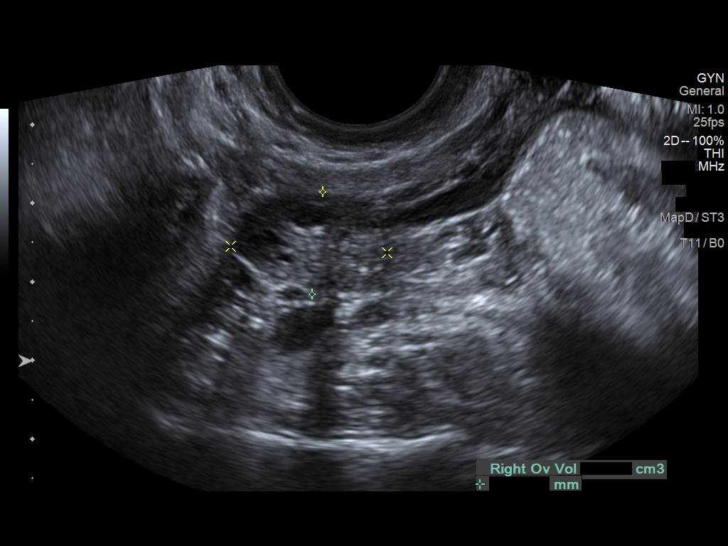
[im 67/89]
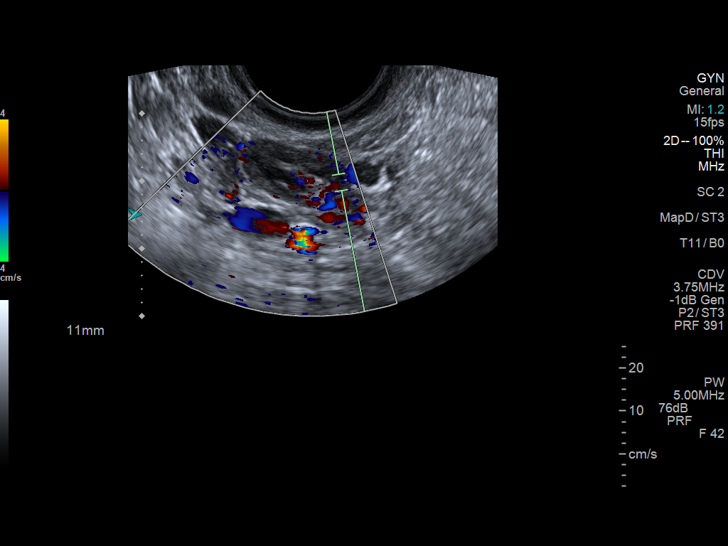
[im 74/89]
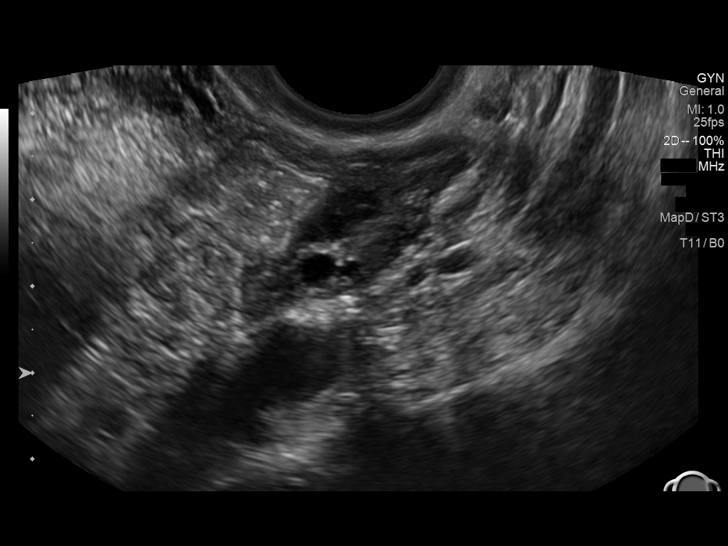
[im 81/89]
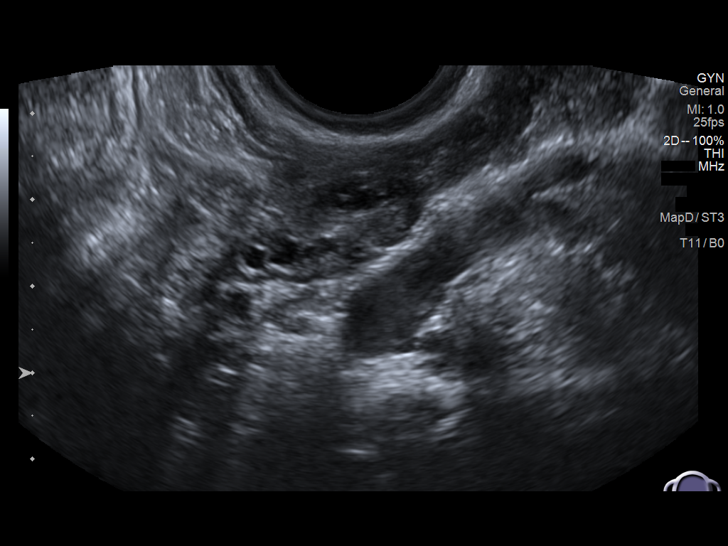
[im 89/89]
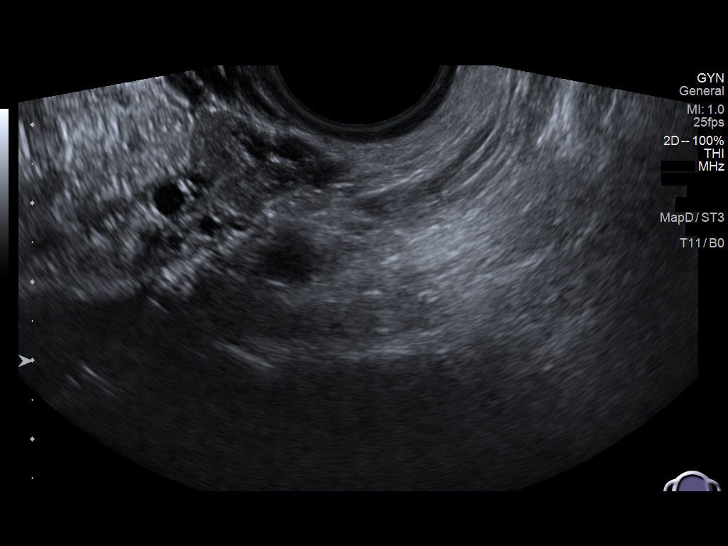

[14 of 25 positions shown; findings below may reference images not displayed]

FINDINGS: Uterus

Measurements: 6.7 x 2.5 x 4.5 = volume: 39 mL. The uterus is
retroverted. No fibroids or other mass visualized.

Endometrium

Thickness: 3 mm.  No focal abnormality visualized.

Right ovary

Measurements: 2.7 x 2.0 x 1.9 = volume: 2.7 mL. Normal appearance/no
adnexal mass.

Left ovary

Measurements: 3.0 x 2.1 x 2.3 = volume: 3.0 mL. Normal appearance/no
adnexal mass.

Other findings

No abnormal free fluid.
IMPRESSION: Normal appearing uterus and ovaries.  Endometrium measuring 3 mm.

## 2022-03-15 ENCOUNTER — Encounter: Payer: Medicaid Other | Admitting: Advanced Practice Midwife

## 2022-04-03 ENCOUNTER — Encounter: Payer: 59 | Admitting: Advanced Practice Midwife

## 2022-05-20 ENCOUNTER — Encounter: Payer: 59 | Admitting: Advanced Practice Midwife

## 2022-05-22 ENCOUNTER — Ambulatory Visit: Payer: 59 | Admitting: Adult Health

## 2022-05-22 ENCOUNTER — Encounter: Payer: Self-pay | Admitting: Adult Health

## 2022-05-22 ENCOUNTER — Other Ambulatory Visit (HOSPITAL_COMMUNITY)
Admission: RE | Admit: 2022-05-22 | Discharge: 2022-05-22 | Disposition: A | Discharge status: Home / Self Care | Payer: 59 | Source: Ambulatory Visit | Attending: Adult Health | Admitting: Adult Health

## 2022-05-22 VITALS — BP 121/84 | HR 71 | Ht 66.5 in | Wt 166.5 lb

## 2022-05-22 DIAGNOSIS — Z01419 Encounter for gynecological examination (general) (routine) without abnormal findings: Secondary | ICD-10-CM | POA: Insufficient documentation

## 2022-05-22 DIAGNOSIS — Z3044 Encounter for surveillance of vaginal ring hormonal contraceptive device: Secondary | ICD-10-CM | POA: Insufficient documentation

## 2022-05-22 MED ORDER — ETONOGESTREL-ETHINYL ESTRADIOL 0.12-0.015 MG/24HR VA RING: 1.0000 | VAGINAL_RING | VAGINAL | 12 refills | Status: DC

## 2022-05-22 NOTE — Progress Notes (Addendum)
Patient ID: Lindsay Robles, female   DOB: 03-20-1991, 31 y.o.   MRN: [id number] History of Present Illness: Lindsay Robles is a 31 year old white female,with SO, G1P1 in for a well woman gyn exam and pap.  She thinks she had one 12/15/20 but not aware of results and not available in Care everywhere. She has had abnormal paps in the past. She is a new pt. PCP is Ricka Burdock MD in [place].   Current Medications, Allergies, Past Medical History, Past Surgical History, Family History and Social History were reviewed in [employer] record.     Review of Systems: Patient denies any headaches, hearing loss, fatigue, blurred vision, shortness of breath, chest pain, abdominal pain, problems with bowel movements, or intercourse. No joint pain or mood swings.  She has some stress incontinence, doing Kegels, will limit caffeine   Physical Exam:BP 121/84 (BP Location: Left Arm, Patient Position: Sitting, Cuff Size: Normal)   Pulse 71   Ht 5' 6.5" (1.689 m)   Wt 166 lb 8 oz (75.5 kg)   LMP 05/17/2022 (Approximate)   BMI 26.47 kg/m   General:  Well developed, well nourished, no acute distress Skin:  Warm and dry Neck:  Midline trachea, normal thyroid, good ROM, no lymphadenopathy Lungs; Clear to auscultation bilaterally Breast:  No dominant palpable mass, retraction, or nipple discharge Cardiovascular: Regular rate and rhythm Abdomen:  Soft, non tender, no hepatosplenomegaly Pelvic:  External genitalia is normal in appearance, no lesions.  The vagina is normal in appearance,nuva ring in place. Urethra has no lesions or masses. The cervix is bulbous,everted at os, pap with GC/CHL and HR HPV genotyping performed. Cervix was friable with EC brush.  Uterus is felt to be normal size, shape, and contour.  No adnexal masses or tenderness noted.Bladder is non tender, no masses felt. Rectal: Deferred Extremities/musculoskeletal:  No swelling or varicosities noted, no clubbing or  cyanosis Psych:  No mood changes, alert and cooperative,seems happy AA is 4 Fall risk is low    05/22/2022    9:28 AM 06/23/2018    1:30 PM 05/29/2017    5:04 PM  Depression screen PHQ 2/9  Decreased Interest 1 0 0  Down, Depressed, Hopeless 2 0 1  PHQ - 2 Score 3 0 1  Altered sleeping 2  3  Tired, decreased energy 2  2  Change in appetite 1  0  Feeling bad or failure about yourself  2  1  Trouble concentrating 2  0  Moving slowly or fidgety/restless 0  0  Suicidal thoughts 0  0  PHQ-9 Score 12  7  Difficult doing work/chores   Not difficult at all   She is on meds    05/22/2022    9:28 AM  GAD 7 : Generalized Anxiety Score  Nervous, Anxious, on Edge 2  Control/stop worrying 1  Worry too much - different things 1  Trouble relaxing 1  Restless 1  Easily annoyed or irritable 2  Afraid - awful might happen 2  Total GAD 7 Score 10    Upstream - 05/22/22 7782       Pregnancy Intention Screening   Does the patient want to become pregnant in the next year? Ok Either Way    Does the patient's partner want to become pregnant in the next year? Ok Either Way    Would the patient like to discuss contraceptive options today? No      Contraception Wrap Up   Current Method  Vaginal Ring    End Method Vaginal Ring              Examination chaperoned by Malachy Mood LPN  Impression and Plan: 1. Encounter for gynecological examination with Papanicolaou smear of cervix Pap sent Pap in 3 years if normal Physical in 1 year Labs with PCP  2. Encounter for surveillance of vaginal ring hormonal contraceptive device She is happy with the ring will refill Meds ordered this encounter  Medications   etonogestrel-ethinyl estradiol (NUVARING) 0.12-0.015 MG/24HR vaginal ring    Sig: Place 1 each vaginally every 28 (twenty-eight) days. Insert vaginally and leave in place for 3 consecutive weeks, then remove for 1 week.    Dispense:  1 each    Refill:  12    Order Specific Question:    Supervising Provider    Answer:   Duane Lope H [[id number]]

## 2022-05-24 LAB — CYTOLOGY - PAP
Chlamydia: NEGATIVE
Comment: NEGATIVE
Comment: NEGATIVE
Comment: NEGATIVE
Comment: NEGATIVE
Comment: NORMAL
Diagnosis: UNDETERMINED — AB
HPV 16: NEGATIVE
HPV 18 / 45: NEGATIVE
High risk HPV: POSITIVE — AB
Neisseria Gonorrhea: NEGATIVE

## 2022-05-28 ENCOUNTER — Encounter: Payer: Self-pay | Admitting: Adult Health

## 2022-05-28 ENCOUNTER — Telehealth: Payer: Self-pay | Admitting: Adult Health

## 2022-05-28 DIAGNOSIS — R87610 Atypical squamous cells of undetermined significance on cytologic smear of cervix (ASC-US): Secondary | ICD-10-CM | POA: Insufficient documentation

## 2022-05-28 HISTORY — DX: Atypical squamous cells of undetermined significance on cytologic smear of cervix (ASC-US): R87.610

## 2022-05-28 NOTE — Telephone Encounter (Signed)
Left message to call office for colposcopy appt, and to check my chart for pap results

## 2022-05-29 ENCOUNTER — Telehealth: Payer: Self-pay | Admitting: Adult Health

## 2022-05-29 NOTE — Telephone Encounter (Signed)
I called patient, no answer. I did leave a voice mail and I also call the mother and left a message with her since we have a DPR for Lindsay Robles to call the office. She has not signed into her mychart so I didn't send a message that way either. I guess the next step would be to send a certified letter.

## 2022-06-24 ENCOUNTER — Encounter: Payer: 59 | Admitting: Women's Health

## 2022-07-01 ENCOUNTER — Ambulatory Visit (INDEPENDENT_AMBULATORY_CARE_PROVIDER_SITE_OTHER): Payer: 59 | Admitting: Women's Health

## 2022-07-01 ENCOUNTER — Encounter: Payer: Self-pay | Admitting: Women's Health

## 2022-07-01 ENCOUNTER — Other Ambulatory Visit (HOSPITAL_COMMUNITY)
Admission: RE | Admit: 2022-07-01 | Discharge: 2022-07-01 | Disposition: A | Discharge status: Home / Self Care | Payer: 59 | Source: Ambulatory Visit | Attending: Women's Health | Admitting: Women's Health

## 2022-07-01 VITALS — BP 123/66 | HR 80 | Ht 67.0 in | Wt 166.0 lb

## 2022-07-01 DIAGNOSIS — Z3202 Encounter for pregnancy test, result negative: Secondary | ICD-10-CM

## 2022-07-01 DIAGNOSIS — R87610 Atypical squamous cells of undetermined significance on cytologic smear of cervix (ASC-US): Secondary | ICD-10-CM | POA: Diagnosis not present

## 2022-07-01 DIAGNOSIS — R69 Illness, unspecified: Secondary | ICD-10-CM | POA: Diagnosis not present

## 2022-07-01 DIAGNOSIS — R87810 Cervical high risk human papillomavirus (HPV) DNA test positive: Secondary | ICD-10-CM | POA: Diagnosis not present

## 2022-07-01 DIAGNOSIS — N870 Mild cervical dysplasia: Secondary | ICD-10-CM | POA: Diagnosis not present

## 2022-07-01 LAB — POCT URINE PREGNANCY: Preg Test, Ur: NEGATIVE

## 2022-07-01 NOTE — Patient Instructions (Signed)
Colposcopy, Care After  The following information offers guidance on how to care for yourself after your procedure. Your health care provider may also give you more specific instructions. If you have problems or questions, contact your health care provider. What can I expect after the procedure? If you had a colposcopy without a biopsy, you can expect to feel fine right away after your procedure. However, you may have some spotting of blood for a few days. You can return to your normal activities. If you had a colposcopy with a biopsy, it is common after the procedure to have: Soreness and mild pain. These may last for a few days. Mild vaginal bleeding or discharge that is dark-colored and grainy. This may last for a few days. The discharge may be caused by a liquid (solution) that was used during the procedure. You may need to wear a sanitary pad during this time. Spotting of blood for at least 48 hours after the procedure. Follow these instructions at home: Medicines Take over-the-counter and prescription medicines only as told by your health care provider. Talk with your health care provider about what type of over-the-counter pain medicines and prescription medicines you can start to take again. It is especially important to talk with your health care provider if you take blood thinners. Activity Avoid using douche products, using tampons, and having sex for at least 3 days after the procedure or for as long as told by your health care provider. Return to your normal activities as told by your health care provider. Ask your health care provider what activities are safe for you. General instructions Ask your health care provider if you may take baths, swim, or use a hot tub. You may take showers. If you use birth control (contraception), continue to use it. Keep all follow-up visits. This is important. Contact a health care provider if: You have a fever or chills. You faint or feel  light-headed. Get help right away if: You have heavy bleeding from your vagina or pass blood clots. Heavy bleeding is bleeding that soaks through a sanitary pad in less than 1 hour. You have vaginal discharge that is abnormal, is yellow in color, or smells bad. This could be a sign of infection. You have severe pain or cramps in your lower abdomen that do not go away with medicine. Summary If you had a colposcopy without a biopsy, you can expect to feel fine right away, but you may have some spotting of blood for a few days. You can return to your normal activities. If you had a colposcopy with a biopsy, it is common to have mild pain for a few days and spotting for 48 hours after the procedure. Avoid using douche products, using tampons, and having sex for at least 3 days after the procedure or for as long as told by your health care provider. Get help right away if you have heavy bleeding, severe pain, or signs of infection. This information is not intended to replace advice given to you by your health care provider. Make sure you discuss any questions you have with your health care provider. Document Revised: 02/18/2021 Document Reviewed: 02/18/2021 Elsevier Patient Education  2023 Elsevier Inc.  

## 2022-07-01 NOTE — Progress Notes (Signed)
   COLPOSCOPY PROCEDURE NOTE Patient name: Lindsay Robles MRN [id number]  Date of birth: 01/15/1991 Subjective Findings:   Lindsay Robles is a 31 y.o. G7P1001 Caucasian female being seen today for a colposcopy. Indication: Abnormal pap on 05/22/22: ASCUS w/ HRHPV positive: other (not 16, 18/45)  Prior cytology:  Date Result Procedure  ~2021 Abnormal per pt in [place] Colposcopy: normal per pt              Patient's last menstrual period was 06/23/2022. Contraception: NuvaRing vaginal inserts. Menopausal: no. Hysterectomy: no.   Smoker: former . Immunocompromised: no.   The risks and benefits were explained and informed consent was obtained, and written copy is in chart. Pertinent History Reviewed:   Reviewed past medical,surgical, social, obstetrical and family history.  Reviewed problem list, medications and allergies. Objective Findings & Procedure:   Vitals:   07/01/22 0948  BP: 123/66  Pulse: 80  Weight: 166 lb (75.3 kg)  Height: 5\' 7"  (1.702 m)  Body mass index is 26 kg/m.  Results for orders placed or performed in visit on 07/01/22 (from the past 24 hour(s))  POCT urine pregnancy   Collection Time: 07/01/22  9:50 AM  Result Value Ref Range   Preg Test, Ur Negative Negative     Time out was performed.  Speculum placed in the vagina, cervix fully visualized. SCJ: fully visualized. Cervix swabbed x 3 with acetic acid.  Acetowhitening present: Yes Cervix: no visible lesions, no mosaicism, no punctation, no abnormal vasculature, and acetowhite lesion(s) noted at 9 o'clock. Cervical biopsies taken at 9 o'clock and Hemostasis achieved with Monsel's solution. Vagina: vaginal colposcopy not performed Vulva: vulvar colposcopy not performed  Specimens: 1  Complications: none  Chaperone: Alice Rieger    Colposcopic Impression & Plan:   Colposcopy findings consistent with LSIL Plan: Post biopsy instructions given, Will notify patient of results when back, and Will  base plan of care on pathology results and ASCCP guidelines  Return in about 1 year (around 07/02/2023) for Pap & physical; get pap records from [place] [facility].  [employer], [facility] 07/01/2022 10:29 AM

## 2022-07-01 NOTE — Addendum Note (Signed)
Addended by: Octaviano Glow on: 07/01/2022 10:55 AM   Modules accepted: Orders

## 2022-07-02 LAB — SURGICAL PATHOLOGY

## 2022-08-01 ENCOUNTER — Ambulatory Visit: Payer: 59 | Admitting: Adult Health

## 2022-08-01 ENCOUNTER — Encounter: Payer: Self-pay | Admitting: Adult Health

## 2022-08-01 VITALS — BP 113/74 | HR 84 | Ht 67.0 in | Wt 166.0 lb

## 2022-08-01 DIAGNOSIS — N941 Unspecified dyspareunia: Secondary | ICD-10-CM

## 2022-08-01 NOTE — Progress Notes (Signed)
  Subjective:     Patient ID: Lindsay Robles, female   DOB: [birth year], 31 y.o.   MRN: [id number]  HPI Lindsay Robles is a 31 year old white female, with SO, G1P1 in complaining of pain with sex for about a month, esp if on top.  Last pap was ASCUS +HPV on 05/22/22. 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  Review of Systems Has pain with sex for about a month, seems more when she is on top, like something hitting No new partner Reviewed past medical,surgical, social and family history. Reviewed medications and allergies.     Objective:   Physical Exam BP 113/74 (BP Location: Left Arm, Patient Position: Sitting, Cuff Size: Normal)   Pulse 84   Ht 5\' 7"  (1.702 m)   Wt 166 lb (75.3 kg)   LMP 06/30/2022 Comment: on [place]  BMI 26.00 kg/m     Skin warm and dry.Pelvic: external genitalia is normal in appearance no lesions, vagina: pink,ring in place,when palpated back toward rectum she says that feels like the pain with sex,can feel stool in rectum,urethra has no lesions or masses noted, cervix:smooth and bulbous,No CMT,  uterus: normal size, shape and contour, non tender, no masses felt, adnexa: no masses or tenderness noted. Bladder is non tender and no masses felt. On rectal exam has stool in vault. Fall risk is low  Upstream - 08/01/22 1625       Pregnancy Intention Screening   Does the patient want to become pregnant in the next year? No    Does the patient's partner want to become pregnant in the next year? No    Would the patient like to discuss contraceptive options today? No      Contraception Wrap Up   Current Method Vaginal Ring    End Method Vaginal Ring            Examination chaperoned by Levy Pupa LPN  Assessment:     1. Dyspareunia in female Try to have sex in morning after BM Use good lubricate and try different positions     Plan:     Follow up prn

## 2022-10-02 DIAGNOSIS — R5383 Other fatigue: Secondary | ICD-10-CM | POA: Diagnosis not present

## 2022-10-02 DIAGNOSIS — R7309 Other abnormal glucose: Secondary | ICD-10-CM | POA: Diagnosis not present

## 2022-10-02 DIAGNOSIS — L659 Nonscarring hair loss, unspecified: Secondary | ICD-10-CM | POA: Diagnosis not present

## 2022-11-15 IMAGING — DX DG LUMBAR SPINE 2-3V
3 series · 3 of 3 positions shown · non-contrast
Comparison: None.

CLINICAL DATA: Low back pain, no known injury

EXAM:
LUMBAR SPINE - 2-3 VIEW

[lumbar spine ap]
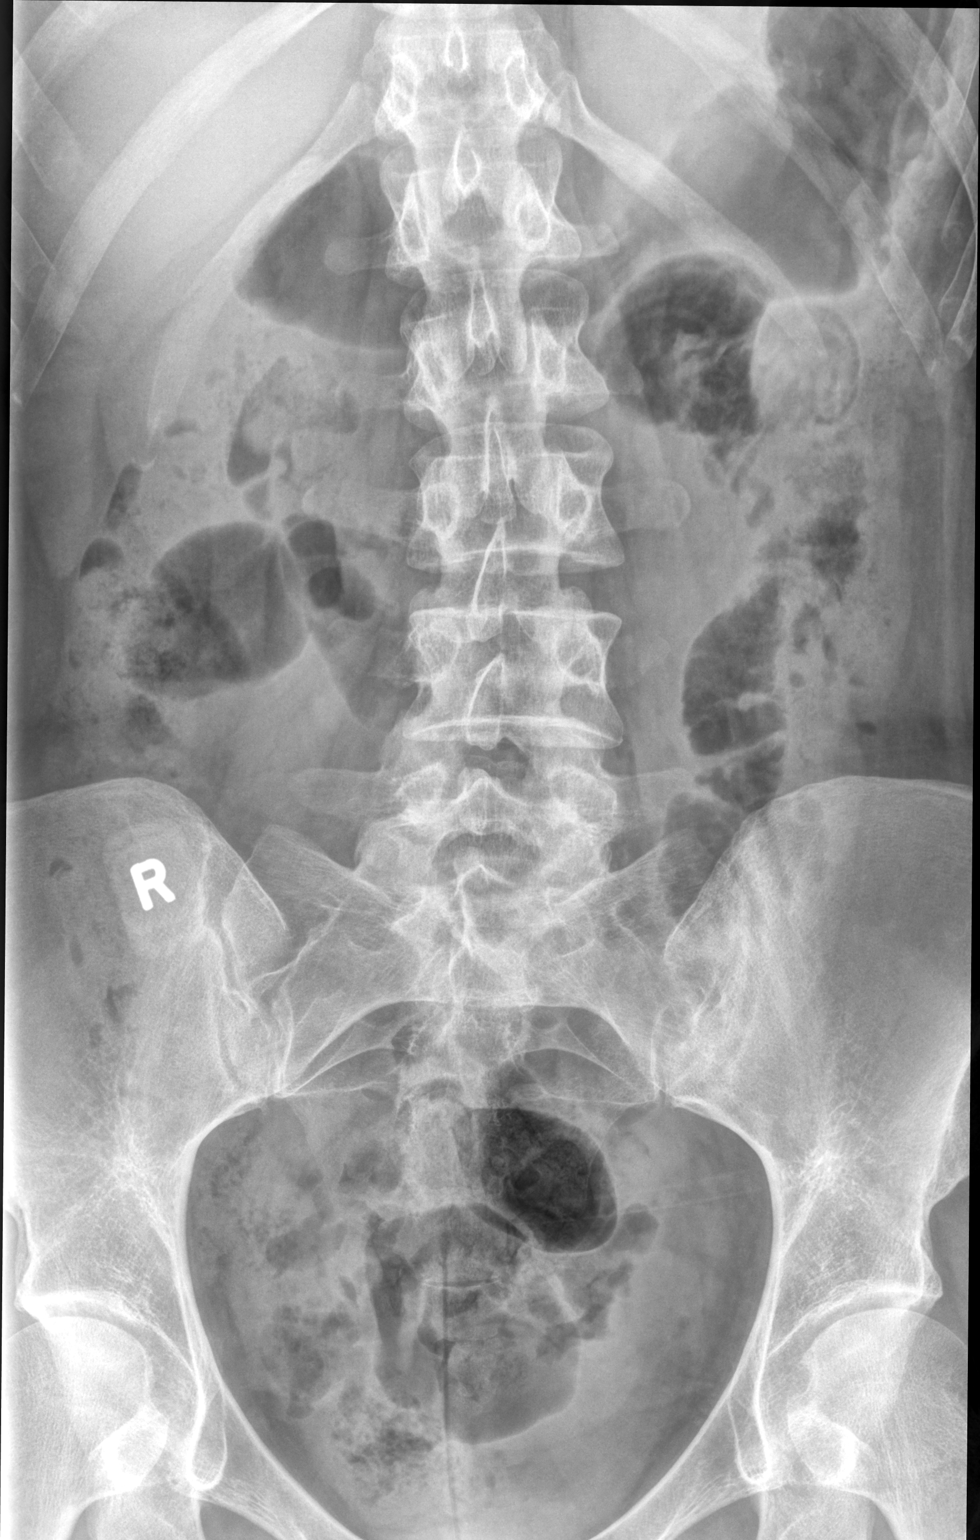

[lumbar spine lat (1 of 2)]
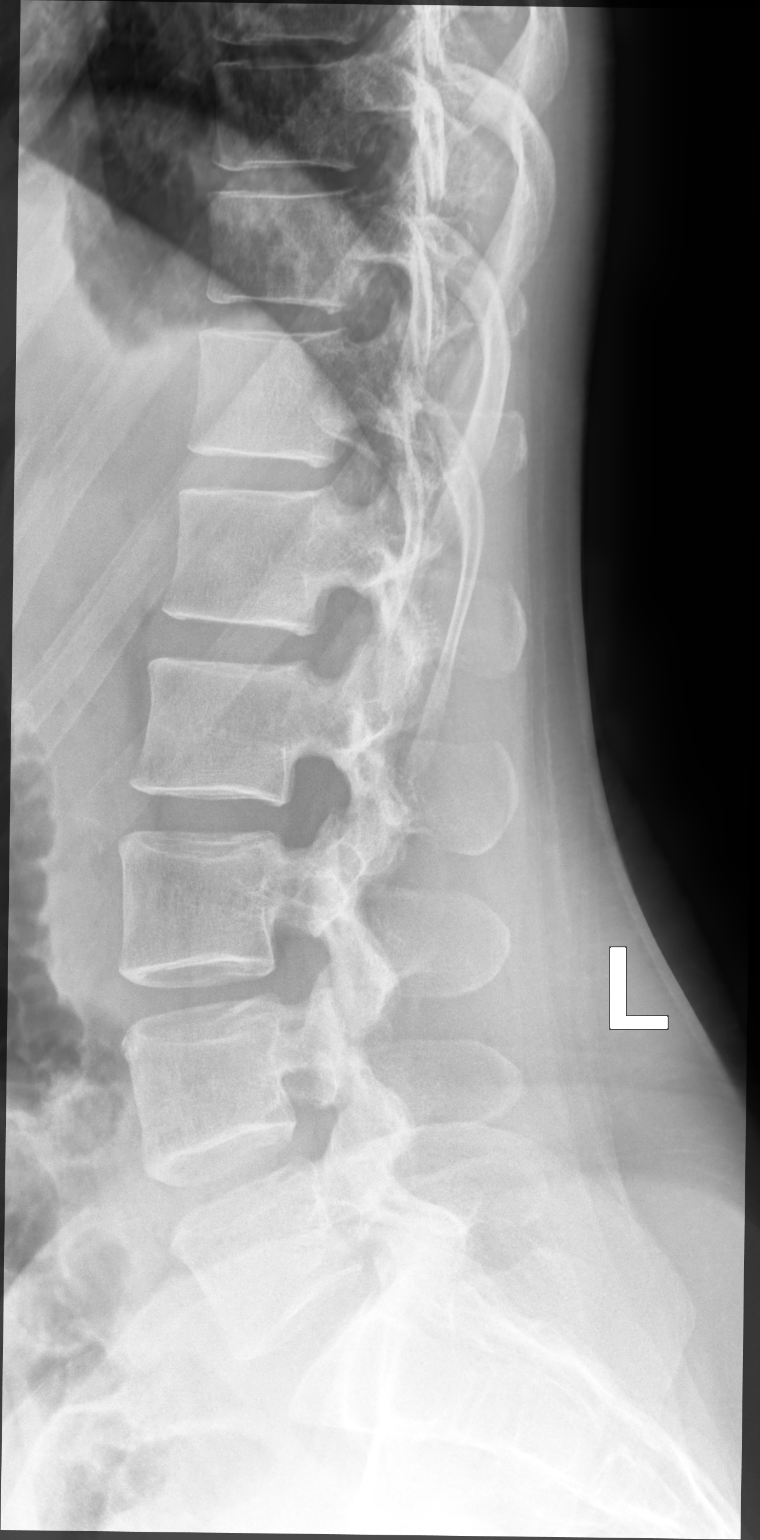

[lumbar spine lat (2 of 2)]
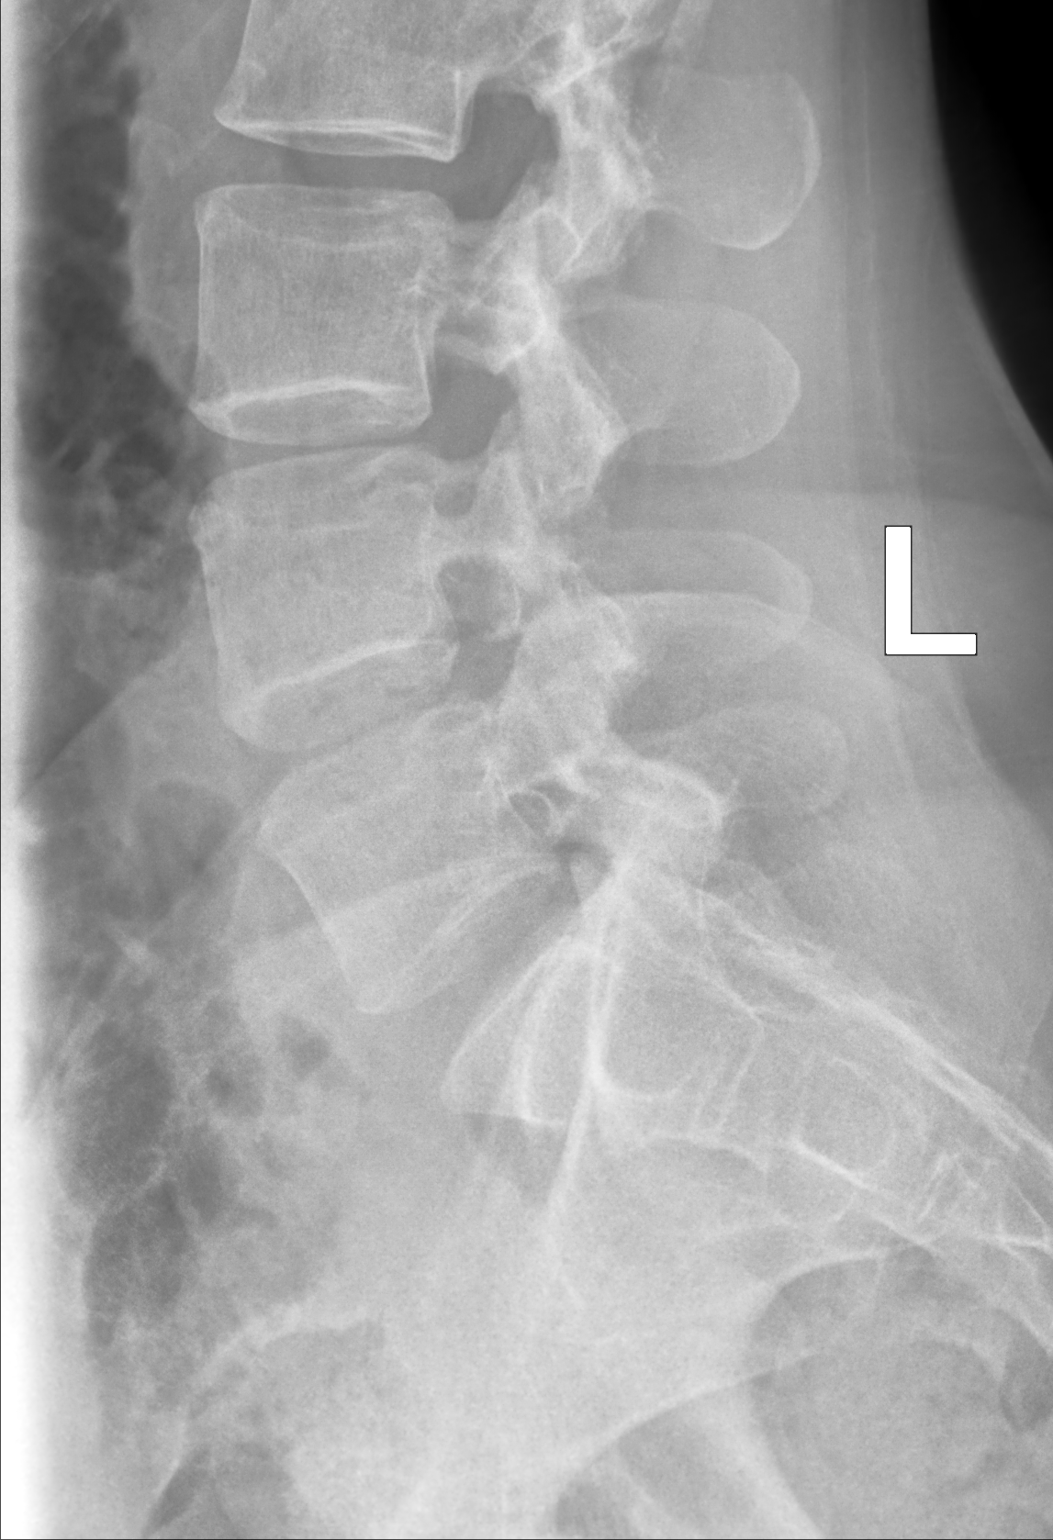

[3 of 3 positions shown; findings below may reference images not displayed]

FINDINGS: There is no evidence of lumbar spine fracture. Alignment is normal.
Intervertebral disc spaces are maintained. Nonobstructive pattern of
overlying bowel gas.
IMPRESSION: No fracture or dislocation of the lumbar spine. Disc spaces and
vertebral body heights are preserved. Lumbar disc and neural
foraminal pathology may be further evaluated by MRI if indicated by
neurologically localizing signs and symptoms.

## 2022-12-05 ENCOUNTER — Telehealth: Payer: Self-pay | Admitting: Adult Health

## 2022-12-05 NOTE — Telephone Encounter (Signed)
Left message @ 3:11 pm letting pt know she will need a self swab before Diflucan can be prescribed. Can try OTC Monistat. [place]

## 2022-12-05 NOTE — Telephone Encounter (Addendum)
Pt gets yeast infections frequently. Can you send in Diflucan? Please advise. Thanks! [place]

## 2022-12-05 NOTE — Telephone Encounter (Signed)
Patient called to see if she could get a rx of diflucan. I informed patient that I didn't see where you had prescribed it before and she would most likely have to come in for a self swab. I informed her of the facility that had sent in a rx of diflucan and she said was going to call them too. Please advise.

## 2023-07-26 ENCOUNTER — Other Ambulatory Visit: Payer: Self-pay | Admitting: Adult Health

## 2023-09-09 ENCOUNTER — Other Ambulatory Visit (HOSPITAL_COMMUNITY)
Admission: RE | Admit: 2023-09-09 | Discharge: 2023-09-09 | Disposition: A | Discharge status: Home / Self Care | Payer: 59 | Source: Ambulatory Visit | Attending: Adult Health | Admitting: Adult Health

## 2023-09-09 ENCOUNTER — Ambulatory Visit: Payer: 59 | Admitting: Adult Health

## 2023-09-09 ENCOUNTER — Encounter: Payer: Self-pay | Admitting: Adult Health

## 2023-09-09 VITALS — BP 131/76 | HR 79 | Ht 67.0 in | Wt 174.0 lb

## 2023-09-09 DIAGNOSIS — Z01419 Encounter for gynecological examination (general) (routine) without abnormal findings: Secondary | ICD-10-CM

## 2023-09-09 DIAGNOSIS — N898 Other specified noninflammatory disorders of vagina: Secondary | ICD-10-CM

## 2023-09-09 DIAGNOSIS — F411 Generalized anxiety disorder: Secondary | ICD-10-CM | POA: Insufficient documentation

## 2023-09-09 DIAGNOSIS — R5383 Other fatigue: Secondary | ICD-10-CM | POA: Insufficient documentation

## 2023-09-09 DIAGNOSIS — R61 Generalized hyperhidrosis: Secondary | ICD-10-CM | POA: Insufficient documentation

## 2023-09-09 DIAGNOSIS — F32A Depression, unspecified: Secondary | ICD-10-CM | POA: Insufficient documentation

## 2023-09-09 DIAGNOSIS — Z3202 Encounter for pregnancy test, result negative: Secondary | ICD-10-CM

## 2023-09-09 DIAGNOSIS — F419 Anxiety disorder, unspecified: Secondary | ICD-10-CM

## 2023-09-09 DIAGNOSIS — R87610 Atypical squamous cells of undetermined significance on cytologic smear of cervix (ASC-US): Secondary | ICD-10-CM

## 2023-09-09 DIAGNOSIS — Z3044 Encounter for surveillance of vaginal ring hormonal contraceptive device: Secondary | ICD-10-CM

## 2023-09-09 LAB — POCT URINE PREGNANCY: Preg Test, Ur: NEGATIVE

## 2023-09-09 MED ORDER — FLUCONAZOLE 150 MG PO TABS: ORAL_TABLET | ORAL | 1 refills | Status: DC

## 2023-09-09 MED ORDER — ETONOGESTREL-ETHINYL ESTRADIOL 0.12-0.015 MG/24HR VA RING: VAGINAL_RING | VAGINAL | 4 refills | Status: AC

## 2023-09-09 NOTE — Progress Notes (Signed)
Patient ID: Lindsay Robles, female   DOB: [birth year], 32 y.o.   MRN: [id number] History of Present Illness: Lindsay Robles is a 32 year old white female,with SO. G1P1001, in for a well woman gyn exam and pap. She is complaining of feeling tired, night sweats, depression, frequent yeast, hair loss and vivid dreams. She had labs 10/02/22 with PCP all normal, seen in Care Everywhere.  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     Review of Systems: Patient denies any headaches, hearing loss, blurred vision, shortness of breath, chest pain, abdominal pain, problems with bowel movements, urination, or intercourse. No joint pain or mood swings.  See HPI for positives.    Physical Exam:BP 131/76 (BP Location: Left Arm, Patient Position: Sitting, Cuff Size: Normal)   Pulse 79   Ht 5\' 7"  (1.702 m)   Wt 174 lb (78.9 kg)   LMP 07/27/2023   BMI 27.25 kg/m  UPT is negative  General:  Well developed, well nourished, no acute distress Skin:  Warm and dry Neck:  Midline trachea, normal thyroid, good ROM, no lymphadenopathy Lungs; Clear to auscultation bilaterally Breast:  No dominant palpable mass, retraction, or nipple discharge Cardiovascular: Regular rate and rhythm Abdomen:  Soft, non tender, no hepatosplenomegaly Pelvic:  External genitalia is normal in appearance, no lesions.  The vagina is normal in appearance, ring in vagina. Urethra has no lesions or masses. The cervix is bulbous,everted at os, pap with HR HPV genotyping performed.  Uterus is felt to be normal size, shape, and contour.  No adnexal masses or tenderness noted.Bladder is non tender, no masses felt. Extremities/musculoskeletal:  No swelling or varicosities noted, no clubbing or cyanosis Psych:  No mood changes, alert and  cooperative,seems happy AA is 2 Fall risk is low    09/09/2023    3:56 PM 05/22/2022    9:28 AM 06/23/2018    1:30 PM  Depression screen PHQ 2/9  Decreased Interest 2 1 0  Down, Depressed, Hopeless 2 2 0  PHQ - 2 Score 4 3 0  Altered sleeping 3 2   Tired, decreased energy 3 2   Change in appetite 2 1   Feeling bad or failure about yourself  2 2   Trouble concentrating 2 2   Moving slowly or fidgety/restless 2 0   Suicidal thoughts 1 0   PHQ-9 Score 19 12        09/09/2023    3:56 PM 05/22/2022    9:28 AM  GAD 7 : Generalized Anxiety Score  Nervous, Anxious, on Edge 2 2  Control/stop worrying 2 1  Worry too much - different things 2 1  Trouble relaxing 2 1  Restless 0 1  Easily annoyed or irritable 3 2  Afraid - awful might happen 2 2  Total GAD 7 Score 13 10    [facility] - 09/09/23 1552       Pregnancy Intention Screening   Does the patient want to become pregnant in the next year? No    Does the patient's partner want to become pregnant in the next year? No    Would the patient like to discuss contraceptive options today? No      Contraception Wrap Up   Current Method Vaginal Ring    End Method Vaginal Ring  Contraception Counseling Provided No            Examination chaperoned by Maury Dus RN     Impression and plan: 1. Encounter for routine gynecological examination with Papanicolaou smear of cervix Pap sent Physical in 1 year Labs with PCP  2. ASCUS with positive high risk HPV cervical Pap sent   3. Anxiety and depression Will refer to [facility]  - Ambulatory referral to Behavioral Health  4. Night sweats  5. Tired Had normal labs 10/02/22  6. Encounter for surveillance of vaginal ring hormonal contraceptive device Happy with the ring will refill  Meds ordered this encounter  Medications   etonogestrel-ethinyl estradiol (NUVARING) 0.12-0.015 MG/24HR vaginal ring    Sig: Insert vaginally and leave in place for 3 consecutive weeks, then  remove for 1 week.    Dispense:  3 each    Refill:  4    Order Specific Question:   Supervising Provider    Answer:   Duane Lope H [[id number]]   fluconazole (DIFLUCAN) 150 MG tablet    Sig: Take 1 now and repeat 1 in 3 days if needed    Dispense:  2 tablet    Refill:  1    Order Specific Question:   Supervising Provider    Answer:   EURE, LUTHER H [[id number]]     7. Vaginal itching Hx of yeast Will rx diflucan

## 2023-09-09 NOTE — Addendum Note (Signed)
Addended by: Moss Mc on: 09/09/2023 04:55 PM   Modules accepted: Orders

## 2023-09-17 LAB — CYTOLOGY - PAP
Adequacy: ABSENT
Comment: NEGATIVE
Comment: NEGATIVE
Comment: NEGATIVE
Diagnosis: UNDETERMINED — AB
HPV 16: NEGATIVE
HPV 18 / 45: NEGATIVE
High risk HPV: POSITIVE — AB

## 2023-09-22 ENCOUNTER — Telehealth: Payer: Self-pay

## 2023-09-22 NOTE — Telephone Encounter (Signed)
Left message letting her know that Victorino Dike had sent her a message in [place] on her labs and to call the office to schedule an appointment.

## 2023-10-22 ENCOUNTER — Ambulatory Visit: Payer: BLUE CROSS/BLUE SHIELD | Admitting: Women's Health

## 2023-10-22 ENCOUNTER — Other Ambulatory Visit (HOSPITAL_COMMUNITY)
Admission: RE | Admit: 2023-10-22 | Discharge: 2023-10-22 | Disposition: A | Discharge status: Home / Self Care | Payer: BLUE CROSS/BLUE SHIELD | Source: Ambulatory Visit | Attending: Women's Health | Admitting: Women's Health

## 2023-10-22 ENCOUNTER — Encounter: Payer: Self-pay | Admitting: Women's Health

## 2023-10-22 VITALS — BP 120/63 | HR 75 | Ht 67.0 in | Wt 180.0 lb

## 2023-10-22 DIAGNOSIS — R87810 Cervical high risk human papillomavirus (HPV) DNA test positive: Secondary | ICD-10-CM | POA: Insufficient documentation

## 2023-10-22 DIAGNOSIS — R87610 Atypical squamous cells of undetermined significance on cytologic smear of cervix (ASC-US): Secondary | ICD-10-CM | POA: Diagnosis present

## 2023-10-22 DIAGNOSIS — Z3202 Encounter for pregnancy test, result negative: Secondary | ICD-10-CM | POA: Diagnosis not present

## 2023-10-22 LAB — POCT URINE PREGNANCY: Preg Test, Ur: NEGATIVE

## 2023-10-22 NOTE — Patient Instructions (Signed)
 Colposcopy, Care After  The following information offers guidance on how to care for yourself after your procedure. Your health care provider may also give you more specific instructions. If you have problems or questions, contact your health care provider. What can I expect after the procedure? If you had a colposcopy without a biopsy, you can expect to feel fine right away after your procedure. However, you may have some spotting of blood for a few days. You can return to your normal activities. If you had a colposcopy with a biopsy, it is common after the procedure to have: Soreness and mild pain. These may last for a few days. Mild vaginal bleeding or discharge that is dark-colored and grainy. This may last for a few days. The discharge may be caused by a liquid (solution) that was used during the procedure. You may need to wear a sanitary pad during this time. Spotting of blood for at least 48 hours after the procedure. Follow these instructions at home: Medicines Take over-the-counter and prescription medicines only as told by your health care provider. Talk with your health care provider about what type of over-the-counter pain medicines and prescription medicines you can start to take again. It is especially important to talk with your health care provider if you take blood thinners. Activity Avoid using douche products, using tampons, and having sex for at least 3 days after the procedure or for as long as told by your health care provider. Return to your normal activities as told by your health care provider. Ask your health care provider what activities are safe for you. General instructions Ask your health care provider if you may take baths, swim, or use a hot tub. You may take showers. If you use birth control (contraception), continue to use it. Keep all follow-up visits. This is important. Contact a health care provider if: You have a fever or chills. You faint or feel  light-headed. Get help right away if: You have heavy bleeding from your vagina or pass blood clots. Heavy bleeding is bleeding that soaks through a sanitary pad in less than 1 hour. You have vaginal discharge that is abnormal, is yellow in color, or smells bad. This could be a sign of infection. You have severe pain or cramps in your lower abdomen that do not go away with medicine. Summary If you had a colposcopy without a biopsy, you can expect to feel fine right away, but you may have some spotting of blood for a few days. You can return to your normal activities. If you had a colposcopy with a biopsy, it is common to have mild pain for a few days and spotting for 48 hours after the procedure. Avoid using douche products, using tampons, and having sex for at least 3 days after the procedure or for as long as told by your health care provider. Get help right away if you have heavy bleeding, severe pain, or signs of infection. This information is not intended to replace advice given to you by your health care provider. Make sure you discuss any questions you have with your health care provider. Document Revised: 02/18/2021 Document Reviewed: 02/18/2021 Elsevier Patient Education  2024 [employer].

## 2023-10-22 NOTE — Addendum Note (Signed)
 Addended by: Laurinda Porch A on: 10/22/2023 04:09 PM   Modules accepted: Orders

## 2023-10-22 NOTE — Progress Notes (Signed)
   COLPOSCOPY PROCEDURE NOTE Patient name: Lindsay Robles MRN [id number]  Date of birth: 02/04/1991 Subjective Findings:   Lindsay Robles is a 33 y.o. G67P1001 Caucasian female being seen today for a colposcopy. Indication: Abnormal pap on 09/09/23: ASCUS w/ HRHPV positive: other (not 16, 18/45)  Prior cytology:  Date Result Procedure  05/22/22 ASCUS w/ HRHPV positive: other (not 16, 18/45) Colposcopy: CIN-1              Patient's last menstrual period was 09/22/2023. Contraception: NuvaRing vaginal inserts. Menopausal: no. Hysterectomy: no.   Considering another pregnancy: Maybe Smoker: no. Immunocompromised: no.   The risks and benefits were explained and informed consent was obtained, and written copy is in chart. Pertinent History Reviewed:   Reviewed past medical,surgical, social, obstetrical and family history.  Reviewed problem list, medications and allergies. Objective Findings & Procedure:   Vitals:   10/22/23 1520  BP: 120/63  Pulse: 75  Weight: 180 lb (81.6 kg)  Height: 5\' 7"  (1.702 m)  Body mass index is 28.19 kg/m.  Results for orders placed or performed in visit on 10/22/23 (from the past 24 hours)  POCT urine pregnancy   Collection Time: 10/22/23  3:27 PM  Result Value Ref Range   Preg Test, Ur Negative Negative     Time out was performed.  Speculum placed in the vagina, cervix fully visualized. SCJ: fully visualized. Cervix swabbed x 3 with acetic acid.  Acetowhitening present: Yes Cervix: no visible lesions, no mosaicism, no punctation, and acetowhite lesion(s) noted at 7 o'clock. Cervical biopsies taken at 7 o'clock and Hemostasis achieved with Monsel's solution. Vagina: vaginal colposcopy not performed Vulva: vulvar colposcopy not performed  Specimens: 1  Complications: none  Chaperone: Laurinda Porch    Colposcopic Impression & Plan:   Colposcopy findings consistent with LSIL Plan: Post biopsy instructions given, Will notify patient of results  when back, and Will base plan of care on pathology results and ASCCP guidelines  Return in about 1 year (around 10/21/2024) for Pap & physical.  Ferd Householder CNM, [facility] 10/22/2023 3:54 PM

## 2023-10-24 LAB — SURGICAL PATHOLOGY

## 2023-10-27 ENCOUNTER — Encounter: Payer: Self-pay | Admitting: Women's Health

## 2023-11-06 ENCOUNTER — Telehealth (HOSPITAL_COMMUNITY): Payer: Self-pay

## 2023-11-06 NOTE — Telephone Encounter (Signed)
Lvm to confirm 11/10/23 by 12:00 11/07/23

## 2023-11-06 NOTE — Telephone Encounter (Signed)
Appt confirmed by pat

## 2023-11-10 ENCOUNTER — Ambulatory Visit (HOSPITAL_COMMUNITY): Payer: BLUE CROSS/BLUE SHIELD | Admitting: Psychiatry

## 2023-11-10 ENCOUNTER — Encounter (HOSPITAL_COMMUNITY): Payer: Self-pay | Admitting: Psychiatry

## 2023-11-10 DIAGNOSIS — F1220 Cannabis dependence, uncomplicated: Secondary | ICD-10-CM | POA: Diagnosis not present

## 2023-11-10 DIAGNOSIS — F1610 Hallucinogen abuse, uncomplicated: Secondary | ICD-10-CM

## 2023-11-10 DIAGNOSIS — F411 Generalized anxiety disorder: Secondary | ICD-10-CM

## 2023-11-10 DIAGNOSIS — Z789 Other specified health status: Secondary | ICD-10-CM | POA: Insufficient documentation

## 2023-11-10 DIAGNOSIS — F19982 Other psychoactive substance use, unspecified with psychoactive substance-induced sleep disorder: Secondary | ICD-10-CM | POA: Diagnosis not present

## 2023-11-10 DIAGNOSIS — F1010 Alcohol abuse, uncomplicated: Secondary | ICD-10-CM

## 2023-11-10 NOTE — Patient Instructions (Addendum)
We did not make any medication changes today.  As we discussed, please begin to cut back on the amount of marijuana you are consuming and this will likely help your sleep as well as depressed mood.  Similarly, try not to consume any caffeine after 12 PM with a goal of cutting back until you are eventually at 8 ounces of coffee first thing in the morning.  If you can, try not to return to any mushroom use as there is a risk of ongoing hallucinations with chronic use.  The CDC also recommends alcohol consumption for women to not be more than 3-4 drinks in a single setting.  You may want to coordinate with your primary care provider about a nutrition referral.
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
## 2023-12-21 ENCOUNTER — Other Ambulatory Visit: Payer: Self-pay | Admitting: Adult Health

## 2023-12-23 ENCOUNTER — Other Ambulatory Visit (HOSPITAL_COMMUNITY)
Admission: RE | Admit: 2023-12-23 | Discharge: 2023-12-23 | Disposition: A | Discharge status: Home / Self Care | Source: Ambulatory Visit | Attending: Adult Health | Admitting: Adult Health

## 2023-12-23 ENCOUNTER — Ambulatory Visit: Admitting: Adult Health

## 2023-12-23 ENCOUNTER — Encounter: Payer: Self-pay | Admitting: Adult Health

## 2023-12-23 VITALS — BP 114/79 | HR 80 | Ht 67.0 in | Wt 180.0 lb

## 2023-12-23 DIAGNOSIS — N898 Other specified noninflammatory disorders of vagina: Secondary | ICD-10-CM | POA: Diagnosis present

## 2023-12-23 DIAGNOSIS — R102 Pelvic and perineal pain: Secondary | ICD-10-CM | POA: Insufficient documentation

## 2023-12-23 DIAGNOSIS — B3731 Acute candidiasis of vulva and vagina: Secondary | ICD-10-CM | POA: Diagnosis not present

## 2023-12-23 NOTE — Progress Notes (Addendum)
  Subjective:     Patient ID: Lindsay Robles, female   DOB: January 09, 1991, 33 y.o.   MRN: [id number]  HPI Lindsay Robles is a 33 year old white female, with SO, G1P1001, in complaining of vaginal pain and itching that started Saturday, after taking amoxicillin for 4 days. Has taken valtrex, diflucan and yeast cream, without relief.      Component Value Date/Time   DIAGPAP (A) 09/09/2023 1637    - Atypical squamous cells of undetermined significance (ASC-US)   DIAGPAP (A) 05/22/2022 0955    - Atypical squamous cells of undetermined significance (ASC-US)   HPVHIGH Positive (A) 09/09/2023 1637   HPVHIGH Positive (A) 05/22/2022 0955   ADEQPAP  09/09/2023 1637    Satisfactory for evaluation; transformation zone component ABSENT.   ADEQPAP  05/22/2022 0955    Satisfactory for evaluation; transformation zone component PRESENT.  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 Has taken valtrex, diflucan and yeast cream, without relief.    Reviewed past medical,surgical, social and family history. Reviewed medications and allergies.  Objective:   Physical Exam BP 114/79 (BP Location: Left Arm, Patient Position: Sitting, Cuff Size: Normal)   Pulse 80   Ht 5\' 7"  (1.702 m)   Wt 180 lb (81.6 kg)   LMP 12/06/2023 (Approximate)   BMI 28.19 kg/m     Skin warm and dry.Pelvic: external genitalia is normal in appearance no lesions, inner labia red, no vesicles noted, no fissures noted, vagina: scant white discharge without odor,urethra has no lesions or masses noted, cervix:smooth and bulbous, uterus: normal size, shape and contour, non tender, no masses felt, adnexa: no masses or tenderness noted. Bladder is non tender and no masses felt. CV swab obtained.painted labia with gentian violet.  Fall risk is low  Upstream - 12/23/23 1141       Pregnancy Intention Screening   Does the patient  want to become pregnant in the next year? No    Does the patient's partner want to become pregnant in the next year? No    Would the patient like to discuss contraceptive options today? No      Contraception Wrap Up   Current Method Vaginal Ring    End Method Vaginal Ring    Contraception Counseling Provided Yes            Examination chaperoned by Malachy Mood LPN  Assessment:     1. Vaginal itching (Primary) Itching started Saturday after 4 days of amoxicillin Has tried diflucan, yeast cream and valtrex without relief  Painted with genian violet Leave alone today  CV swab sent for GC/CHL,trich,BV and yeast  - Cervicovaginal ancillary only( [place])  2. Vaginal pain Has pain in labial area, - Cervicovaginal ancillary only( [place])     Plan:     Follow up prn

## 2023-12-24 LAB — CERVICOVAGINAL ANCILLARY ONLY
Bacterial Vaginitis (gardnerella): NEGATIVE
Candida Glabrata: NEGATIVE
Candida Vaginitis: POSITIVE — AB
Chlamydia: NEGATIVE
Comment: NEGATIVE
Comment: NEGATIVE
Comment: NEGATIVE
Comment: NEGATIVE
Comment: NEGATIVE
Comment: NORMAL
Neisseria Gonorrhea: NEGATIVE
Trichomonas: NEGATIVE

## 2023-12-25 ENCOUNTER — Other Ambulatory Visit: Payer: Self-pay | Admitting: Adult Health

## 2023-12-25 MED ORDER — TERCONAZOLE 0.4 % VA CREA: 1.0000 | TOPICAL_CREAM | Freq: Every day | VAGINAL | 0 refills | Status: AC

## 2023-12-26 ENCOUNTER — Other Ambulatory Visit: Payer: Self-pay | Admitting: Adult Health

## 2023-12-26 MED ORDER — NYSTATIN-TRIAMCINOLONE 100000-0.1 UNIT/GM-% EX OINT: 1.0000 | TOPICAL_OINTMENT | Freq: Two times a day (BID) | CUTANEOUS | 0 refills | Status: DC

## 2023-12-26 MED ORDER — TRIAMCINOLONE ACETONIDE 0.5 % EX OINT: 1.0000 | TOPICAL_OINTMENT | Freq: Two times a day (BID) | CUTANEOUS | 0 refills | Status: DC

## 2023-12-26 MED ORDER — TRIAMCINOLONE ACETONIDE 0.5 % EX OINT: 1.0000 | TOPICAL_OINTMENT | Freq: Two times a day (BID) | CUTANEOUS | 0 refills | Status: AC

## 2023-12-26 MED ORDER — NYSTATIN 100000 UNIT/GM EX OINT: 1.0000 | TOPICAL_OINTMENT | Freq: Two times a day (BID) | CUTANEOUS | 0 refills | Status: AC

## 2023-12-26 NOTE — Progress Notes (Signed)
Resent triamcinolone 

## 2023-12-26 NOTE — Progress Notes (Signed)
 Will rx nystatin and triamcinolone separate, mycolog not covered

## 2023-12-26 NOTE — Progress Notes (Signed)
Rx mycolog

## 2023-12-29 ENCOUNTER — Ambulatory Visit: Admitting: Obstetrics & Gynecology

## 2023-12-29 ENCOUNTER — Encounter: Payer: Self-pay | Admitting: Obstetrics & Gynecology

## 2023-12-29 VITALS — BP 121/74 | HR 74 | Ht 67.0 in | Wt 178.0 lb

## 2023-12-29 DIAGNOSIS — A609 Anogenital herpesviral infection, unspecified: Secondary | ICD-10-CM | POA: Diagnosis not present

## 2023-12-29 MED ORDER — ACYCLOVIR 800 MG PO TABS: 800.0000 mg | ORAL_TABLET | Freq: Three times a day (TID) | ORAL | 3 refills | Status: AC

## 2023-12-29 MED ORDER — LIDOCAINE 5 % EX OINT: 1.0000 | TOPICAL_OINTMENT | CUTANEOUS | 0 refills | Status: AC | PRN

## 2023-12-29 NOTE — Progress Notes (Signed)
   GYN VISIT Patient name: Lindsay Robles MRN [id number]  Date of birth: 1991/05/13 Chief Complaint:   Vaginitis  History of Present Illness:   Lindsay Robles is a 33 y.o. G66P1001 female being seen today for acute vaginitis  Mostly burning and notes considerable pain especially when skin is being touched.  Also notes burning with urination.  Pt notes remote h/o HSV- taking valtrex (Rx many years old) and doesn't feel like it's helping.  No vaginal discharge currently.  Denies pelvic or abdominal pain.  No vaginal odor.  Pt seen by Roseanne Reno on 3/18- diagnosed with yeast infection Already tried Diflucan and yeast cream without relief.  Currently using Triamcinolone, Nystatin as well as silvadene and feeling awful.   Patient's last menstrual period was 12/06/2023 (approximate).    Review of Systems:   Pertinent items are noted in HPI Denies fever/chills, dizziness, headaches, visual disturbances, fatigue, shortness of breath, chest pain, abdominal pain, vomiting. Pertinent History Reviewed:   Past Surgical History:  Procedure Laterality Date   NO PAST SURGERIES      Past Medical History:  Diagnosis Date   ASCUS with positive high risk HPV cervical 05/28/2022   05/28/22 colpo needed per ASCCP guidelines immediate risk if CIN 3+ is 4.4%   Bipolar 1 disorder (HCC)    Depression    Headache    Herpes simplex virus (HSV) infection    HPV (human papilloma virus) anogenital infection    Infection    UTI- frequent   Reviewed problem list, medications and allergies. Physical Assessment:   Vitals:   12/29/23 1108  BP: 121/74  Pulse: 74  Weight: 178 lb (80.7 kg)  Height: 5\' 7"  (1.702 m)  Body mass index is 27.88 kg/m.       Physical Examination:   General appearance: alert, well appearing, and in no distress  Psych: mood appropriate, normal affect  Skin: warm & dry   Cardiovascular: normal heart rate noted  Respiratory: normal respiratory effort, no  distress  Abdomen: soft, non-tender   Pelvic: VULVA: HSV outbreak noted on right labia minora with ulceration.  Significant erythema along edge of labia minora extending from clitoris to mid portion of labia.  +tenderness to palpation  Extremities: no edema   Chaperone: Faith Rogue    Assessment & Plan:  1) HSV outbreak -per pt valtrex not always effective- plan for Acyclovir 800mg  TID x 2-5 days -Lidocaine to use sparingly -discontinue other prescriptions and reviewed conservative management  Meds ordered this encounter  Medications   acyclovir (ZOVIRAX) 800 MG tablet    Sig: Take 1 tablet (800 mg total) by mouth 3 (three) times daily for 5 days.    Dispense:  15 tablet    Refill:  3   lidocaine (XYLOCAINE) 5 % ointment    Sig: Apply 1 Application topically as needed.    Dispense:  35 g    Refill:  0      Return if symptoms worsen or fail to improve.   Myna Hidalgo, DO Attending Obstetrician & Gynecologist, [facility] for [employer], [facility]

## 2024-01-01 ENCOUNTER — Encounter: Payer: Self-pay | Admitting: Obstetrics & Gynecology

## 2024-06-16 ENCOUNTER — Other Ambulatory Visit: Payer: Self-pay | Admitting: Adult Health

## 2024-06-16 MED ORDER — FLUCONAZOLE 150 MG PO TABS: ORAL_TABLET | ORAL | 1 refills | Status: AC

## 2024-06-16 NOTE — Progress Notes (Signed)
 Rx diflucan.
